# Patient Record
Sex: Female | Born: 1975 | Race: White | Hispanic: No | Smoking: Former smoker
Health system: Southern US, Community
[De-identification: ages and names within clinical notes are randomized; demographics above are authoritative.]

## PROBLEM LIST (undated history)

## (undated) DIAGNOSIS — IMO0002 Reserved for concepts with insufficient information to code with codable children: Secondary | ICD-10-CM

## (undated) DIAGNOSIS — K589 Irritable bowel syndrome without diarrhea: Secondary | ICD-10-CM

## (undated) DIAGNOSIS — B009 Herpesviral infection, unspecified: Secondary | ICD-10-CM

## (undated) DIAGNOSIS — N83209 Unspecified ovarian cyst, unspecified side: Secondary | ICD-10-CM

## (undated) DIAGNOSIS — F329 Major depressive disorder, single episode, unspecified: Secondary | ICD-10-CM

## (undated) DIAGNOSIS — R87612 Low grade squamous intraepithelial lesion on cytologic smear of cervix (LGSIL): Secondary | ICD-10-CM

## (undated) DIAGNOSIS — D239 Other benign neoplasm of skin, unspecified: Secondary | ICD-10-CM

## (undated) DIAGNOSIS — F32A Depression, unspecified: Secondary | ICD-10-CM

## (undated) DIAGNOSIS — A6920 Lyme disease, unspecified: Secondary | ICD-10-CM

## (undated) DIAGNOSIS — F419 Anxiety disorder, unspecified: Secondary | ICD-10-CM

## (undated) DIAGNOSIS — R87619 Unspecified abnormal cytological findings in specimens from cervix uteri: Secondary | ICD-10-CM

## (undated) HISTORY — PX: LAPAROSCOPY: SHX197

## (undated) HISTORY — DX: Reserved for concepts with insufficient information to code with codable children: IMO0002

## (undated) HISTORY — DX: Herpesviral infection, unspecified: B00.9

## (undated) HISTORY — DX: Unspecified abnormal cytological findings in specimens from cervix uteri: R87.619

## (undated) HISTORY — DX: Other benign neoplasm of skin, unspecified: D23.9

## (undated) HISTORY — DX: Lyme disease, unspecified: A69.20

## (undated) HISTORY — DX: Low grade squamous intraepithelial lesion on cytologic smear of cervix (LGSIL): R87.612

## (undated) HISTORY — DX: Irritable bowel syndrome, unspecified: K58.9

## (undated) HISTORY — PX: CYSTECTOMY: SUR359

## (undated) HISTORY — DX: Major depressive disorder, single episode, unspecified: F32.9

## (undated) HISTORY — DX: Anxiety disorder, unspecified: F41.9

## (undated) HISTORY — DX: Depression, unspecified: F32.A

## (undated) HISTORY — DX: Unspecified ovarian cyst, unspecified side: N83.209

## (undated) HISTORY — PX: WISDOM TOOTH EXTRACTION: SHX21

---

## 2004-05-25 ENCOUNTER — Other Ambulatory Visit: Admission: RE | Admit: 2004-05-25 | Discharge: 2004-05-25 | Payer: Self-pay | Admitting: Obstetrics and Gynecology

## 2004-07-05 ENCOUNTER — Emergency Department (HOSPITAL_COMMUNITY): Admission: EM | Admit: 2004-07-05 | Discharge: 2004-07-05 | Payer: Self-pay | Admitting: Emergency Medicine

## 2004-08-23 ENCOUNTER — Other Ambulatory Visit: Admission: RE | Admit: 2004-08-23 | Discharge: 2004-08-23 | Payer: Self-pay | Admitting: Obstetrics and Gynecology

## 2004-08-31 ENCOUNTER — Ambulatory Visit (HOSPITAL_COMMUNITY): Admission: RE | Admit: 2004-08-31 | Discharge: 2004-08-31 | Payer: Self-pay | Admitting: Family Medicine

## 2004-10-29 ENCOUNTER — Emergency Department (HOSPITAL_COMMUNITY): Admission: EM | Admit: 2004-10-29 | Discharge: 2004-10-29 | Payer: Self-pay | Admitting: Emergency Medicine

## 2004-11-17 ENCOUNTER — Ambulatory Visit: Payer: Self-pay | Admitting: Family Medicine

## 2004-11-19 ENCOUNTER — Ambulatory Visit (HOSPITAL_COMMUNITY): Admission: RE | Admit: 2004-11-19 | Discharge: 2004-11-19 | Payer: Self-pay | Admitting: *Deleted

## 2004-11-19 ENCOUNTER — Encounter (INDEPENDENT_AMBULATORY_CARE_PROVIDER_SITE_OTHER): Payer: Self-pay | Admitting: Specialist

## 2005-01-18 ENCOUNTER — Ambulatory Visit: Payer: Self-pay | Admitting: Family Medicine

## 2005-02-23 ENCOUNTER — Ambulatory Visit: Payer: Self-pay | Admitting: Internal Medicine

## 2005-03-08 ENCOUNTER — Ambulatory Visit: Payer: Self-pay | Admitting: Internal Medicine

## 2005-03-29 ENCOUNTER — Other Ambulatory Visit: Admission: RE | Admit: 2005-03-29 | Discharge: 2005-03-29 | Payer: Self-pay | Admitting: Obstetrics and Gynecology

## 2005-06-07 ENCOUNTER — Ambulatory Visit: Payer: Self-pay | Admitting: Internal Medicine

## 2005-06-10 ENCOUNTER — Ambulatory Visit: Payer: Self-pay | Admitting: Internal Medicine

## 2005-06-10 ENCOUNTER — Ambulatory Visit (HOSPITAL_COMMUNITY): Admission: RE | Admit: 2005-06-10 | Discharge: 2005-06-10 | Payer: Self-pay | Admitting: Internal Medicine

## 2005-09-06 ENCOUNTER — Other Ambulatory Visit: Admission: RE | Admit: 2005-09-06 | Discharge: 2005-09-06 | Payer: Self-pay | Admitting: Obstetrics and Gynecology

## 2005-10-28 ENCOUNTER — Encounter: Admission: RE | Admit: 2005-10-28 | Discharge: 2005-10-28 | Payer: Self-pay | Admitting: Gastroenterology

## 2006-04-27 ENCOUNTER — Other Ambulatory Visit: Admission: RE | Admit: 2006-04-27 | Discharge: 2006-04-27 | Payer: Self-pay | Admitting: Obstetrics and Gynecology

## 2006-05-05 ENCOUNTER — Ambulatory Visit (HOSPITAL_COMMUNITY): Admission: RE | Admit: 2006-05-05 | Discharge: 2006-05-05 | Payer: Self-pay | Admitting: Gastroenterology

## 2006-09-21 ENCOUNTER — Other Ambulatory Visit: Admission: RE | Admit: 2006-09-21 | Discharge: 2006-09-21 | Payer: Self-pay | Admitting: Obstetrics and Gynecology

## 2006-11-13 ENCOUNTER — Ambulatory Visit: Payer: Self-pay | Admitting: Family Medicine

## 2006-11-16 ENCOUNTER — Ambulatory Visit: Payer: Self-pay | Admitting: Family Medicine

## 2006-11-16 LAB — CONVERTED CEMR LAB
ALT: 19 units/L (ref 0–40)
AST: 18 units/L (ref 0–37)
Albumin: 4.3 g/dL (ref 3.5–5.2)
Alkaline Phosphatase: 27 units/L — ABNORMAL LOW (ref 39–117)
BUN: 8 mg/dL (ref 6–23)
Basophils Absolute: 0.1 10*3/uL (ref 0.0–0.1)
Basophils Relative: 1.2 % — ABNORMAL HIGH (ref 0.0–1.0)
CO2: 25 meq/L (ref 19–32)
Calcium: 9.4 mg/dL (ref 8.4–10.5)
Chloride: 98 meq/L (ref 96–112)
Creatinine, Ser: 0.9 mg/dL (ref 0.4–1.2)
Eosinophil percent: 1.2 % (ref 0.0–5.0)
GFR calc non Af Amer: 78 mL/min
Glomerular Filtration Rate, Af Am: 94 mL/min/{1.73_m2}
Glucose, Bld: 84 mg/dL (ref 70–99)
HCT: 42.3 % (ref 36.0–46.0)
Hemoglobin: 14.4 g/dL (ref 12.0–15.0)
Heterophile Ab Screen: NEGATIVE
Lymphocytes Relative: 24 % (ref 12.0–46.0)
MCHC: 34.1 g/dL (ref 30.0–36.0)
MCV: 95.3 fL (ref 78.0–100.0)
Monocytes Absolute: 0.3 10*3/uL (ref 0.2–0.7)
Monocytes Relative: 4.9 % (ref 3.0–11.0)
Neutro Abs: 4.7 10*3/uL (ref 1.4–7.7)
Neutrophils Relative %: 68.7 % (ref 43.0–77.0)
Platelets: 297 10*3/uL (ref 150–400)
Potassium: 4.2 meq/L (ref 3.5–5.1)
RBC: 4.44 M/uL (ref 3.87–5.11)
RDW: 11.4 % — ABNORMAL LOW (ref 11.5–14.6)
Sodium: 132 meq/L — ABNORMAL LOW (ref 135–145)
Total Bilirubin: 0.8 mg/dL (ref 0.3–1.2)
Total Protein: 7.1 g/dL (ref 6.0–8.3)
WBC: 6.8 10*3/uL (ref 4.5–10.5)

## 2006-11-17 ENCOUNTER — Ambulatory Visit: Payer: Self-pay | Admitting: Family Medicine

## 2006-11-22 ENCOUNTER — Ambulatory Visit: Payer: Self-pay | Admitting: Internal Medicine

## 2006-11-23 ENCOUNTER — Ambulatory Visit: Payer: Self-pay | Admitting: Internal Medicine

## 2006-11-23 LAB — CONVERTED CEMR LAB
BUN: 8 mg/dL (ref 6–23)
CO2: 26 meq/L (ref 19–32)
Calcium: 9.3 mg/dL (ref 8.4–10.5)
Chloride: 99 meq/L (ref 96–112)
Creatinine, Ser: 0.9 mg/dL (ref 0.4–1.2)
GFR calc non Af Amer: 78 mL/min
Glomerular Filtration Rate, Af Am: 94 mL/min/{1.73_m2}
Glucose, Bld: 96 mg/dL (ref 70–99)
Potassium: 4 meq/L (ref 3.5–5.1)
Sodium: 132 meq/L — ABNORMAL LOW (ref 135–145)

## 2008-02-07 ENCOUNTER — Telehealth (INDEPENDENT_AMBULATORY_CARE_PROVIDER_SITE_OTHER): Payer: Self-pay | Admitting: *Deleted

## 2008-02-08 ENCOUNTER — Ambulatory Visit: Payer: Self-pay | Admitting: Internal Medicine

## 2008-02-08 LAB — CONVERTED CEMR LAB
Bilirubin Urine: NEGATIVE
Blood in Urine, dipstick: NEGATIVE
Glucose, Urine, Semiquant: NEGATIVE
Ketones, urine, test strip: NEGATIVE
Nitrite: NEGATIVE
Protein, U semiquant: NEGATIVE
Specific Gravity, Urine: <1.005
Urobilinogen, UA: NEGATIVE
WBC Urine, dipstick: NEGATIVE
pH: 6.5

## 2008-02-09 ENCOUNTER — Encounter: Payer: Self-pay | Admitting: Internal Medicine

## 2008-02-12 ENCOUNTER — Encounter (INDEPENDENT_AMBULATORY_CARE_PROVIDER_SITE_OTHER): Payer: Self-pay | Admitting: *Deleted

## 2008-04-07 ENCOUNTER — Telehealth (INDEPENDENT_AMBULATORY_CARE_PROVIDER_SITE_OTHER): Payer: Self-pay | Admitting: *Deleted

## 2008-04-10 ENCOUNTER — Telehealth (INDEPENDENT_AMBULATORY_CARE_PROVIDER_SITE_OTHER): Payer: Self-pay | Admitting: *Deleted

## 2008-04-11 ENCOUNTER — Ambulatory Visit: Payer: Self-pay | Admitting: Internal Medicine

## 2008-04-11 DIAGNOSIS — R0602 Shortness of breath: Secondary | ICD-10-CM | POA: Insufficient documentation

## 2008-04-11 DIAGNOSIS — R5383 Other fatigue: Secondary | ICD-10-CM

## 2008-04-11 DIAGNOSIS — R5381 Other malaise: Secondary | ICD-10-CM | POA: Insufficient documentation

## 2008-04-11 LAB — CONVERTED CEMR LAB
Glucose, Bld: 109 mg/dL
Hemoglobin: 14.8 g/dL

## 2008-04-14 ENCOUNTER — Encounter (INDEPENDENT_AMBULATORY_CARE_PROVIDER_SITE_OTHER): Payer: Self-pay | Admitting: *Deleted

## 2008-04-14 LAB — CONVERTED CEMR LAB
ALT: 17 units/L (ref 0–35)
AST: 21 units/L (ref 0–37)
Albumin: 4.2 g/dL (ref 3.5–5.2)
Alkaline Phosphatase: 30 units/L — ABNORMAL LOW (ref 39–117)
BUN: 12 mg/dL (ref 6–23)
Basophils Absolute: 0 10*3/uL (ref 0.0–0.1)
Basophils Relative: 0.2 % (ref 0.0–1.0)
Bilirubin, Direct: 0.1 mg/dL (ref 0.0–0.3)
CO2: 28 meq/L (ref 19–32)
Calcium: 9.3 mg/dL (ref 8.4–10.5)
Chloride: 102 meq/L (ref 96–112)
Creatinine, Ser: 0.7 mg/dL (ref 0.4–1.2)
Eosinophils Absolute: 0.1 10*3/uL (ref 0.0–0.7)
Eosinophils Relative: 1.7 % (ref 0.0–5.0)
GFR calc Af Amer: 124 mL/min
GFR calc non Af Amer: 102 mL/min
Glucose, Bld: 90 mg/dL (ref 70–99)
HCT: 41.7 % (ref 36.0–46.0)
Hemoglobin: 13.9 g/dL (ref 12.0–15.0)
Lymphocytes Relative: 23.5 % (ref 12.0–46.0)
MCHC: 33.4 g/dL (ref 30.0–36.0)
MCV: 92.8 fL (ref 78.0–100.0)
Monocytes Absolute: 0.4 10*3/uL (ref 0.1–1.0)
Monocytes Relative: 7.9 % (ref 3.0–12.0)
Neutro Abs: 3.7 10*3/uL (ref 1.4–7.7)
Neutrophils Relative %: 66.7 % (ref 43.0–77.0)
Platelets: 274 10*3/uL (ref 150–400)
Potassium: 4.2 meq/L (ref 3.5–5.1)
RBC: 4.5 M/uL (ref 3.87–5.11)
RDW: 11.8 % (ref 11.5–14.6)
Sed Rate: 9 mm/hr (ref 0–22)
Sodium: 134 meq/L — ABNORMAL LOW (ref 135–145)
TSH: 1.46 microintl units/mL (ref 0.35–5.50)
Total Bilirubin: 1 mg/dL (ref 0.3–1.2)
Total CK: 60 units/L (ref 7–177)
Total Protein: 6.7 g/dL (ref 6.0–8.3)
WBC: 5.5 10*3/uL (ref 4.5–10.5)

## 2008-05-16 ENCOUNTER — Ambulatory Visit: Payer: Self-pay | Admitting: Family Medicine

## 2008-05-16 ENCOUNTER — Encounter: Payer: Self-pay | Admitting: Family Medicine

## 2008-05-16 ENCOUNTER — Telehealth (INDEPENDENT_AMBULATORY_CARE_PROVIDER_SITE_OTHER): Payer: Self-pay | Admitting: *Deleted

## 2008-05-16 DIAGNOSIS — J209 Acute bronchitis, unspecified: Secondary | ICD-10-CM | POA: Insufficient documentation

## 2008-05-19 ENCOUNTER — Emergency Department (HOSPITAL_COMMUNITY): Admission: EM | Admit: 2008-05-19 | Discharge: 2008-05-19 | Payer: Self-pay | Admitting: Emergency Medicine

## 2008-05-19 ENCOUNTER — Telehealth (INDEPENDENT_AMBULATORY_CARE_PROVIDER_SITE_OTHER): Payer: Self-pay | Admitting: *Deleted

## 2008-05-19 ENCOUNTER — Encounter (INDEPENDENT_AMBULATORY_CARE_PROVIDER_SITE_OTHER): Payer: Self-pay | Admitting: *Deleted

## 2008-05-19 LAB — CONVERTED CEMR LAB
ALT: 14 units/L (ref 0–35)
AST: 15 units/L (ref 0–37)
Albumin: 4.6 g/dL (ref 3.5–5.2)
Alkaline Phosphatase: 32 units/L — ABNORMAL LOW (ref 39–117)
BUN: 11 mg/dL (ref 6–23)
Basophils Absolute: 0 10*3/uL (ref 0.0–0.1)
Basophils Relative: 0 % (ref 0–1)
Bilirubin, Direct: 0.1 mg/dL (ref 0.0–0.3)
CO2: 21 meq/L (ref 19–32)
Calcium: 9.5 mg/dL (ref 8.4–10.5)
Chloride: 101 meq/L (ref 96–112)
Creatinine, Ser: 0.77 mg/dL (ref 0.40–1.20)
Eosinophils Absolute: 0 10*3/uL (ref 0.0–0.7)
Eosinophils Relative: 0 % (ref 0–5)
Glucose, Bld: 91 mg/dL (ref 70–99)
HCT: 41.5 % (ref 36.0–46.0)
Hemoglobin: 13.8 g/dL (ref 12.0–15.0)
Indirect Bilirubin: 0.5 mg/dL (ref 0.0–0.9)
Lymphocytes Relative: 16 % (ref 12–46)
Lymphs Abs: 1.3 10*3/uL (ref 0.7–4.0)
MCHC: 33.3 g/dL (ref 30.0–36.0)
MCV: 92 fL (ref 78.0–100.0)
Mono Screen: NEGATIVE
Monocytes Absolute: 0.4 10*3/uL (ref 0.1–1.0)
Monocytes Relative: 5 % (ref 3–12)
Neutro Abs: 6.4 10*3/uL (ref 1.7–7.7)
Neutrophils Relative %: 78 % — ABNORMAL HIGH (ref 43–77)
Platelets: 271 10*3/uL (ref 150–400)
Potassium: 4.4 meq/L (ref 3.5–5.3)
RBC: 4.51 M/uL (ref 3.87–5.11)
RDW: 12.6 % (ref 11.5–15.5)
Sodium: 135 meq/L (ref 135–145)
Total Bilirubin: 0.6 mg/dL (ref 0.3–1.2)
Total Protein: 7.2 g/dL (ref 6.0–8.3)
WBC: 8.2 10*3/uL (ref 4.0–10.5)

## 2008-05-20 ENCOUNTER — Telehealth (INDEPENDENT_AMBULATORY_CARE_PROVIDER_SITE_OTHER): Payer: Self-pay | Admitting: *Deleted

## 2008-05-21 ENCOUNTER — Ambulatory Visit: Payer: Self-pay | Admitting: Internal Medicine

## 2008-05-21 DIAGNOSIS — R0609 Other forms of dyspnea: Secondary | ICD-10-CM | POA: Insufficient documentation

## 2008-05-21 DIAGNOSIS — R0989 Other specified symptoms and signs involving the circulatory and respiratory systems: Secondary | ICD-10-CM

## 2008-05-21 DIAGNOSIS — R011 Cardiac murmur, unspecified: Secondary | ICD-10-CM | POA: Insufficient documentation

## 2008-05-21 DIAGNOSIS — R079 Chest pain, unspecified: Secondary | ICD-10-CM | POA: Insufficient documentation

## 2008-05-23 ENCOUNTER — Ambulatory Visit: Payer: Self-pay | Admitting: Cardiology

## 2008-05-23 ENCOUNTER — Encounter: Payer: Self-pay | Admitting: Internal Medicine

## 2008-05-26 ENCOUNTER — Telehealth (INDEPENDENT_AMBULATORY_CARE_PROVIDER_SITE_OTHER): Payer: Self-pay | Admitting: *Deleted

## 2008-05-26 ENCOUNTER — Encounter (INDEPENDENT_AMBULATORY_CARE_PROVIDER_SITE_OTHER): Payer: Self-pay | Admitting: *Deleted

## 2008-05-27 ENCOUNTER — Ambulatory Visit: Payer: Self-pay | Admitting: Family Medicine

## 2008-05-28 LAB — CONVERTED CEMR LAB
ANA Titer 1: 1:40 {titer} — ABNORMAL HIGH
Anti Nuclear Antibody(ANA): POSITIVE — AB
Rheumatoid fact SerPl-aCnc: <20 intl units/mL — ABNORMAL LOW (ref 0.0–20.0)
Sed Rate: 4 mm/hr (ref 0–22)
Total CK: 27 units/L (ref 7–177)
Vit D, 1,25-Dihydroxy: 31 (ref 30–89)

## 2008-05-29 ENCOUNTER — Encounter (INDEPENDENT_AMBULATORY_CARE_PROVIDER_SITE_OTHER): Payer: Self-pay | Admitting: *Deleted

## 2008-05-29 ENCOUNTER — Telehealth (INDEPENDENT_AMBULATORY_CARE_PROVIDER_SITE_OTHER): Payer: Self-pay | Admitting: *Deleted

## 2008-06-03 ENCOUNTER — Ambulatory Visit: Payer: Self-pay | Admitting: Internal Medicine

## 2008-06-03 ENCOUNTER — Encounter: Payer: Self-pay | Admitting: Family Medicine

## 2008-06-06 ENCOUNTER — Encounter (INDEPENDENT_AMBULATORY_CARE_PROVIDER_SITE_OTHER): Payer: Self-pay | Admitting: *Deleted

## 2008-06-20 ENCOUNTER — Ambulatory Visit: Payer: Self-pay | Admitting: Internal Medicine

## 2008-06-20 DIAGNOSIS — J309 Allergic rhinitis, unspecified: Secondary | ICD-10-CM | POA: Insufficient documentation

## 2008-07-02 ENCOUNTER — Ambulatory Visit: Payer: Self-pay

## 2008-07-02 ENCOUNTER — Encounter: Payer: Self-pay | Admitting: Internal Medicine

## 2008-07-07 ENCOUNTER — Encounter: Payer: Self-pay | Admitting: Internal Medicine

## 2008-07-10 ENCOUNTER — Ambulatory Visit: Payer: Self-pay | Admitting: Internal Medicine

## 2008-07-31 ENCOUNTER — Encounter: Payer: Self-pay | Admitting: Family Medicine

## 2008-09-08 ENCOUNTER — Encounter: Payer: Self-pay | Admitting: Family Medicine

## 2008-09-10 ENCOUNTER — Ambulatory Visit (HOSPITAL_BASED_OUTPATIENT_CLINIC_OR_DEPARTMENT_OTHER): Admission: RE | Admit: 2008-09-10 | Discharge: 2008-09-10 | Payer: Self-pay | Admitting: Rheumatology

## 2008-09-13 ENCOUNTER — Ambulatory Visit: Payer: Self-pay | Admitting: Internal Medicine

## 2008-10-13 ENCOUNTER — Encounter: Payer: Self-pay | Admitting: Family Medicine

## 2008-10-16 ENCOUNTER — Encounter: Payer: Self-pay | Admitting: Internal Medicine

## 2008-10-29 ENCOUNTER — Telehealth (INDEPENDENT_AMBULATORY_CARE_PROVIDER_SITE_OTHER): Payer: Self-pay | Admitting: *Deleted

## 2008-12-09 ENCOUNTER — Encounter: Payer: Self-pay | Admitting: Family Medicine

## 2008-12-25 ENCOUNTER — Encounter: Payer: Self-pay | Admitting: Family Medicine

## 2009-04-13 ENCOUNTER — Telehealth (INDEPENDENT_AMBULATORY_CARE_PROVIDER_SITE_OTHER): Payer: Self-pay | Admitting: *Deleted

## 2009-05-24 ENCOUNTER — Observation Stay (HOSPITAL_COMMUNITY): Admission: AD | Admit: 2009-05-24 | Discharge: 2009-05-25 | Payer: Self-pay | Admitting: Obstetrics and Gynecology

## 2009-06-30 ENCOUNTER — Inpatient Hospital Stay (HOSPITAL_COMMUNITY): Admission: AD | Admit: 2009-06-30 | Discharge: 2009-07-01 | Payer: Self-pay | Admitting: Obstetrics and Gynecology

## 2009-08-22 ENCOUNTER — Inpatient Hospital Stay (HOSPITAL_COMMUNITY): Admission: AD | Admit: 2009-08-22 | Discharge: 2009-08-22 | Payer: Self-pay | Admitting: Obstetrics and Gynecology

## 2009-09-03 ENCOUNTER — Inpatient Hospital Stay (HOSPITAL_COMMUNITY): Admission: RE | Admit: 2009-09-03 | Discharge: 2009-09-03 | Payer: Self-pay | Admitting: Obstetrics and Gynecology

## 2009-09-04 ENCOUNTER — Inpatient Hospital Stay (HOSPITAL_COMMUNITY): Admission: RE | Admit: 2009-09-04 | Discharge: 2009-09-07 | Payer: Self-pay | Admitting: Obstetrics and Gynecology

## 2009-09-04 ENCOUNTER — Encounter (INDEPENDENT_AMBULATORY_CARE_PROVIDER_SITE_OTHER): Payer: Self-pay | Admitting: Obstetrics and Gynecology

## 2009-09-11 ENCOUNTER — Inpatient Hospital Stay (HOSPITAL_COMMUNITY): Admission: AD | Admit: 2009-09-11 | Discharge: 2009-09-11 | Payer: Self-pay | Admitting: Obstetrics and Gynecology

## 2009-09-22 ENCOUNTER — Encounter: Payer: Self-pay | Admitting: Family Medicine

## 2009-11-28 HISTORY — PX: SEPTOPLASTY WITH ETHMOIDECTOMY, AND MAXILLARY ANTROSTOMY: SHX6090

## 2010-04-21 IMAGING — US US AMNIOCENTESIS
1 series · 3 of 3 positions shown · non-contrast
Comparison: none

CLINICAL DATA: ULTRASOUND-GUIDED AMNIOCENTESIS:
 Ultrasound was utilized to perform amniocentesis by the requesting physician.

[Series 1: us amniocentesis · 3 of 3 slices shown]
[im 1/3]
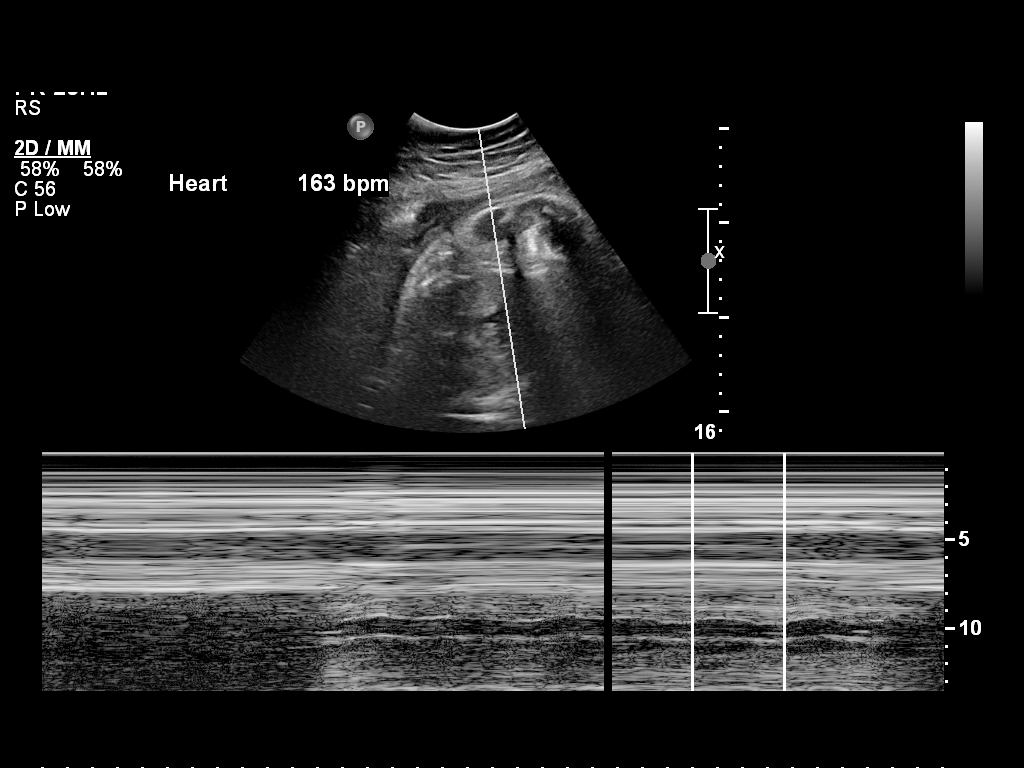
[im 2/3]
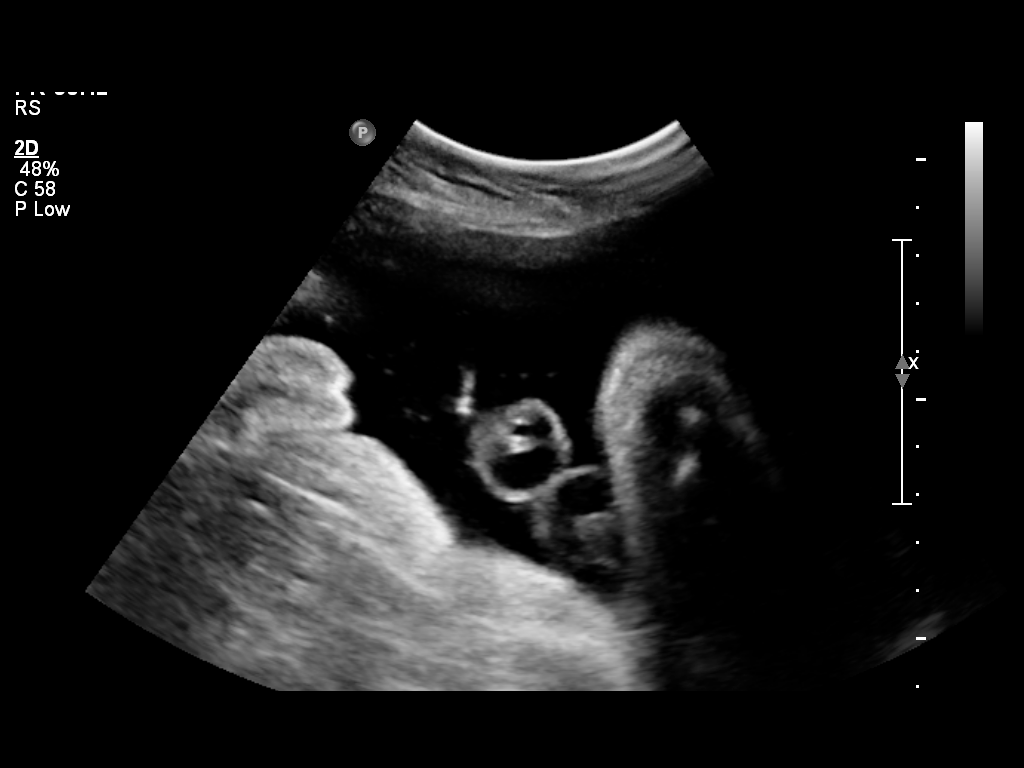
[im 3/3]
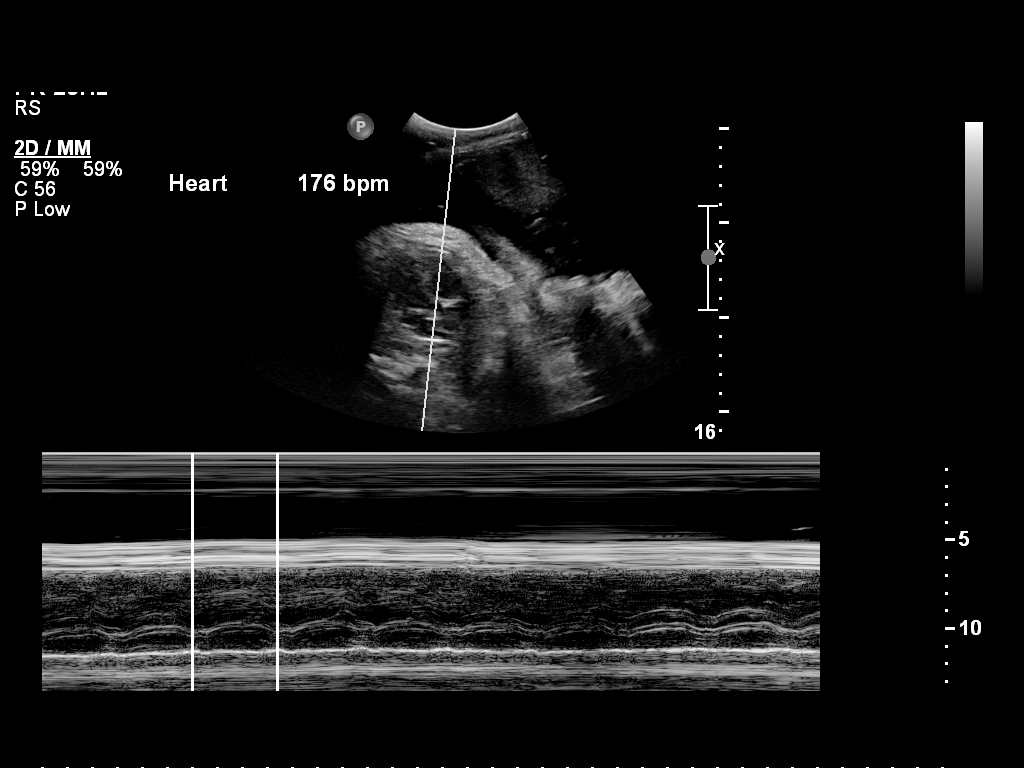

[3 of 3 positions shown; findings below may reference images not displayed]

IMPRESSION: No radiographic interpretation.

## 2011-03-03 LAB — CBC
HCT: 31.6 % — ABNORMAL LOW (ref 36.0–46.0)
Hemoglobin: 10.7 g/dL — ABNORMAL LOW (ref 12.0–15.0)
MCV: 92.5 fL (ref 78.0–100.0)
RBC: 4.02 MIL/uL (ref 3.87–5.11)
WBC: 11.6 10*3/uL — ABNORMAL HIGH (ref 4.0–10.5)
WBC: 15.4 10*3/uL — ABNORMAL HIGH (ref 4.0–10.5)

## 2011-03-03 LAB — TYPE AND SCREEN
ABO/RH(D): A POS
Antibody Screen: NEGATIVE

## 2011-03-03 LAB — LSPG (L/S RATIO WITH PG)-AMNIO FLUID

## 2011-03-03 LAB — RPR: RPR Ser Ql: NONREACTIVE

## 2011-03-04 LAB — CULTURE, BETA STREP (GROUP B ONLY)

## 2011-03-04 LAB — URINALYSIS, ROUTINE W REFLEX MICROSCOPIC
Bilirubin Urine: NEGATIVE
Glucose, UA: NEGATIVE mg/dL
Hgb urine dipstick: NEGATIVE
Specific Gravity, Urine: 1.01 (ref 1.005–1.030)
pH: 5.5 (ref 5.0–8.0)

## 2011-03-04 LAB — WET PREP, GENITAL: Trich, Wet Prep: NONE SEEN

## 2011-03-04 LAB — GC/CHLAMYDIA PROBE AMP, GENITAL: Chlamydia, DNA Probe: NEGATIVE

## 2011-03-05 LAB — URINALYSIS, ROUTINE W REFLEX MICROSCOPIC
Bilirubin Urine: NEGATIVE
Hgb urine dipstick: NEGATIVE
Nitrite: NEGATIVE
Specific Gravity, Urine: <1.005 — ABNORMAL LOW (ref 1.005–1.030)
pH: 5.5 (ref 5.0–8.0)

## 2011-03-05 LAB — URINE MICROSCOPIC-ADD ON

## 2011-03-05 LAB — WET PREP, GENITAL: Trich, Wet Prep: NONE SEEN

## 2011-03-07 LAB — URINE MICROSCOPIC-ADD ON: RBC / HPF: NONE SEEN RBC/hpf (ref <3)

## 2011-03-07 LAB — URINALYSIS, ROUTINE W REFLEX MICROSCOPIC
Glucose, UA: NEGATIVE mg/dL
Hgb urine dipstick: NEGATIVE
Protein, ur: NEGATIVE mg/dL
pH: 7 (ref 5.0–8.0)

## 2011-03-07 LAB — CBC
HCT: 35 % — ABNORMAL LOW (ref 36.0–46.0)
Platelets: 192 10*3/uL (ref 150–400)
WBC: 10 10*3/uL (ref 4.0–10.5)

## 2011-04-12 NOTE — Discharge Summary (Signed)
NAMENEYSHA, CRIADO NO.:  [id number]   MEDICAL RECORD NO.:  [id number]          PATIENT TYPE:  OBV   LOCATION:  9151                          FACILITY:  [facility]   PHYSICIAN:  Hal Morales, M.D.DATE OF BIRTH:  Aug 01, 1976   DATE OF ADMISSION:  05/24/2009  DATE OF DISCHARGE:  05/25/2009                               DISCHARGE SUMMARY   HISTORY OF PRESENT ILLNESS:  A 35 year old gravida 1, para 0 at 62 and  1/7th week who presented to [facility] for lower abdominal pain.  She  does have a history of low-lying placenta previa.  Her EDC is September 25, 2009.   ADMITTING DIAGNOSES:  Previa on an 18-week ultrasound, posterior previa  placenta, allergy, history of herpes simplex virus II, and history of  abnormal pap test.   DISCHARGE DIAGNOSES:  1. Previa.  2. PENICILLIN allergies.  3. Herpes simplex virus 2.  4. Pap within normal limits.   LABORATORY DATA:  She is A positive, antibiotic screen negative, rubella  immune, hemoglobin was 12.2.  HSV was positive on Neo B admission.   PROCEDURE:  She had an ultrasound on the 27th.   Her cervix is closed.  Fetal heart tone 147, posterior previa.  Her AFI  was within normal limits according to the ultrasound.  On day of  discharge, which is May 25, 2009, she is 22-2/7th week.  She does  complain of continuing clear watery discharge from vagina but not  increased from previously, positive fetal movement.  No bleeding from  uterine crampiness.  Fetal heart tones 150 to 160.  Hemoglobin was 12.3.  She is A positive.  Normal ultrasound, May 24, 2009.  Lungs clear.  CTAB.  Normal heart rate.  Regular rhythm.  Abdomen soft and nontender.  Gravida 1-2 cm above umbilicus.  Average gestational age.  DTRs are +1  x2.  Negative Homans x2.   ASSESSMENT:  A 35 year old gravida 1 at 22-2/7th weeks previa uterine  irritability.   The plan is to discharge home after AFI, and AFI was normal.   DISCHARGE INSTRUCTIONS:  1.  Continue Rhinocort nasal spray, Pulmicort Flexhaler, ibuprofen 800      mg, and Vicodin.  2. She is to be off work for 1 week.  3. Pre-term labor precautions were discussed, and the patient      verbalizes understanding.  She is to follow up with Dr. Pennie Rushing on      June 02, 2009, or sooner if necessary.  The patient verbalizes      understanding.      Jasmine Awe, CNM      Hal Morales, M.D.  Electronically Signed    JM/MEDQ  D:  05/25/2009  T:  05/26/2009  Job:  [id number]

## 2011-04-12 NOTE — Procedures (Signed)
NAMELANORE, RENDEROS NO.:  [id number]   MEDICAL RECORD NO.:  [id number]          PATIENT TYPE:  OUT   LOCATION:  SLEEP CENTER                 FACILITY:  [facility]   PHYSICIAN:  Clinton D. Maple Hudson, MD, FCCP, FACPDATE OF BIRTH:  1975-03-02   DATE OF STUDY:  09/10/2008                            NOCTURNAL POLYSOMNOGRAM   REFERRING PHYSICIAN:  Pollyann Savoy, M.D.   INDICATION FOR STUDY:  Hypersomnia with sleep apnea.   EPWORTH SLEEPINESS SCORE:  Epworth sleepiness score 16/24.  BMI 25.4.  Weight 162 pounds.  Height 67 inches.  Neck 13 inches.   MEDICATIONS:  Home medication charted and reviewed.   SLEEP ARCHITECTURE:  Total sleep time 330 minutes with sleep efficiency  78%.  Stage I 10.1%.  Stage II 66.6%.  Stage III 12.7%.  REM 10.6% of  total sleep time.  Sleep latency 26 minutes.  REM latency 146 minutes.  Awake after sleep onset 67 minutes.  Arousal index 18.7.  No bedtime  medication was reported.  She was awake from about 4:45 a.m. and then  awake again after 5 a.m.   RESPIRATORY DATA:  Apnea-hypopnea index (AHI) 0.2 per hour.  A single  event was scored as an obstructive apnea while lying supine in non-REM  sleep.   OXYGEN DATA:  No snoring noted by the technician.  Oxygen desaturation  to a nadir of 96% with mean oxygen saturation 97.1% on room air through  the study.   CARDIAC DATA:  Normal sinus rhythm.   MOVEMENT-PARASOMNIA:  No significant movement disturbance.  Bathroom x3.   IMPRESSIONS-RECOMMENDATIONS:  1. Sleep architecture significant for little maintained sleep after 4      a.m. with reduced total percentage REM.  This pattern can be      indicative of depression, but is nonspecific.  2. Rare respiratory events with sleep disturbance, apnea-hypopnea      index 0.2 per hour (normal range 0 to 5 per      hour).  No snoring noted.  Mean oxygen saturation 97.1%.  3. Bathroom x3.      Clinton D. Maple Hudson, MD, [facility], FACP  Diplomate, Research scientist (medical) of Sleep Medicine  Electronically Signed     CDY/MEDQ  D:  09/13/2008 18:33:41  T:  09/14/2008 02:03:36  Job:  [id number]
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
## 2011-08-25 LAB — DIFFERENTIAL
Basophils Absolute: 0
Basophils Relative: 0
Eosinophils Absolute: 0
Eosinophils Relative: 0
Lymphocytes Relative: 5 — ABNORMAL LOW
Lymphs Abs: 0.5 — ABNORMAL LOW
Monocytes Absolute: 0.1
Monocytes Relative: 1 — ABNORMAL LOW
Neutro Abs: 9.8 — ABNORMAL HIGH
Neutrophils Relative %: 93 — ABNORMAL HIGH

## 2011-08-25 LAB — COMPREHENSIVE METABOLIC PANEL WITH GFR
Alkaline Phosphatase: 35 — ABNORMAL LOW
BUN: 10
Calcium: 10
Creatinine, Ser: 0.83
Glucose, Bld: 108 — ABNORMAL HIGH
Total Protein: 7.5

## 2011-08-25 LAB — URINALYSIS, ROUTINE W REFLEX MICROSCOPIC
Bilirubin Urine: NEGATIVE
Glucose, UA: NEGATIVE
Hgb urine dipstick: NEGATIVE
Ketones, ur: NEGATIVE
Nitrite: NEGATIVE
Protein, ur: NEGATIVE
Specific Gravity, Urine: 1.004 — ABNORMAL LOW
Urobilinogen, UA: 0.2
pH: 6.5

## 2011-08-25 LAB — MONONUCLEOSIS SCREEN: Mono Screen: NEGATIVE

## 2011-08-25 LAB — COMPREHENSIVE METABOLIC PANEL
ALT: 19
AST: 21
Albumin: 4.4
CO2: 26
Chloride: 99
GFR calc Af Amer: >60
GFR calc non Af Amer: >60
Potassium: 4.2
Sodium: 135
Total Bilirubin: 1.1

## 2011-08-25 LAB — CBC
HCT: 44.7
Hemoglobin: 15.6 — ABNORMAL HIGH
MCHC: 35
MCV: 90.1
Platelets: 291
RBC: 4.96
RDW: 12
WBC: 10.5

## 2011-08-25 LAB — LIPASE, BLOOD: Lipase: 17

## 2011-08-25 LAB — POCT PREGNANCY, URINE
Operator id: 264031
Preg Test, Ur: NEGATIVE

## 2012-09-04 ENCOUNTER — Telehealth: Payer: Self-pay | Admitting: Obstetrics and Gynecology

## 2012-09-12 ENCOUNTER — Encounter: Payer: Self-pay | Admitting: Obstetrics and Gynecology

## 2012-09-12 ENCOUNTER — Ambulatory Visit (INDEPENDENT_AMBULATORY_CARE_PROVIDER_SITE_OTHER): Payer: Managed Care, Other (non HMO) | Admitting: Obstetrics and Gynecology

## 2012-09-12 ENCOUNTER — Other Ambulatory Visit: Payer: Self-pay | Admitting: Obstetrics and Gynecology

## 2012-09-12 ENCOUNTER — Ambulatory Visit (INDEPENDENT_AMBULATORY_CARE_PROVIDER_SITE_OTHER): Payer: Managed Care, Other (non HMO)

## 2012-09-12 VITALS — BP 126/72 | Temp 98.3°F | Ht 67.0 in | Wt 182.0 lb

## 2012-09-12 DIAGNOSIS — N949 Unspecified condition associated with female genital organs and menstrual cycle: Secondary | ICD-10-CM

## 2012-09-12 DIAGNOSIS — N83209 Unspecified ovarian cyst, unspecified side: Secondary | ICD-10-CM | POA: Insufficient documentation

## 2012-09-12 DIAGNOSIS — R102 Pelvic and perineal pain: Secondary | ICD-10-CM

## 2012-09-12 DIAGNOSIS — IMO0002 Reserved for concepts with insufficient information to code with codable children: Secondary | ICD-10-CM | POA: Insufficient documentation

## 2012-09-12 DIAGNOSIS — F419 Anxiety disorder, unspecified: Secondary | ICD-10-CM | POA: Insufficient documentation

## 2012-09-12 DIAGNOSIS — F32A Depression, unspecified: Secondary | ICD-10-CM

## 2012-09-12 DIAGNOSIS — F418 Other specified anxiety disorders: Secondary | ICD-10-CM | POA: Insufficient documentation

## 2012-09-12 DIAGNOSIS — F329 Major depressive disorder, single episode, unspecified: Secondary | ICD-10-CM

## 2012-09-12 DIAGNOSIS — F3289 Other specified depressive episodes: Secondary | ICD-10-CM

## 2012-09-12 DIAGNOSIS — B009 Herpesviral infection, unspecified: Secondary | ICD-10-CM | POA: Insufficient documentation

## 2012-09-12 LAB — POCT URINALYSIS DIPSTICK
Bilirubin, UA: NEGATIVE
Ketones, UA: NEGATIVE
Leukocytes, UA: NEGATIVE
Protein, UA: NEGATIVE
Spec Grav, UA: <=1.005

## 2012-09-12 MED ORDER — ESCITALOPRAM OXALATE 10 MG PO TABS: 20.0000 mg | ORAL_TABLET | Freq: Every day | ORAL | Status: DC

## 2012-09-12 MED ORDER — DESOGESTREL-ETHINYL ESTRADIOL 0.15-0.02/0.01 MG (21/5) PO TABS: 1.0000 | ORAL_TABLET | Freq: Every day | ORAL | Status: DC

## 2012-09-12 NOTE — Progress Notes (Signed)
Pelvic Pain:  GYN PROBLEM VISIT  [name] is a 36 y.o. year old female,G1P1, who presents for a problem visit. D/c'd BCPs 8 mos ago and periods were OK with manageable cramps till 2 mos ago.  Restarted BCPs last cycle but pain has not resolved completely.  Now noticed increased fatigue and depression.  Did stop Lexapro 5 mos ago  Subjective:  Pt c/o Pelvic Pain x 1 month. States that she began bc pill again and it seem to help a little. But she still feels "achy". States that it is mostly on left side.no urinary symptoms.  No nausea vomiting or diarrhea.   She denies dysuria or other urinary symptoms.  Wants to talk about changing bc pills because she has been feeling moody. She has a long history of depression and deviated recently discontinued her antidepressants.  H also be noted that she has moved from [place] to [place] and started a new position all within the last 3 months.   Also c/o rash on chest for the last 2 weeks.  The rash is ready and a red with itching Pt states that she had pap done at another Dr. In [place].   Vag. Discharge:no Odor:no Fever:no Irreg.Periods:yes Dyspareunia:yes Dysuria:no Frequency:no Urgency:no Hematuria:no Kidney stones:no Constipation:no Diarrhea:no Rectal Bleeding: no Vomiting:no Nausea:no Pregnant:no Fibroids:no Endometriosis:no Hx of Ovarian Cyst:yes Hx IUD:no Hx STD-PID:yes Appendectomy:no Gall Bladder Dz:no  Objective:  BP 126/72  Ht 5\' 7"  (1.702 m)  Wt 182 lb (82.555 kg)  BMI 28.51 kg/m2  LMP 09/11/2012   General: alert, cooperative and ray E fine papular rash over the sternal area  External genitalia: normal general appearance Vaginal: normal mucosa without prolapse or lesions Cervix: normal appearance Adnexa: no  Masses were noted.  There was tenderness on the left greater than on the right. Uterus: normal single, nontender  ULTRASOUND: Uterus is antevert and was in normal limits Endometrium is normal at 6  mm Ovaries normal bilaterally No free fluid Significant bowel gas and stool note  Assessment: New onset pelvic pain.  Cannot completely rule out endometriosis. Mood changes in patient with history of chronic depression, now status post recent discontinuation of antidepressant Circumscribed dermatitis without clear etiology  Plan: Patient is to continue her current birth control pill Restart Lexapro 20 mg daily Rate for a primary care physician for management of depression Return to office in 3 month(s).   Dierdre Forth, MD  09/12/2012 10:45 AM

## 2012-09-13 ENCOUNTER — Telehealth: Payer: Self-pay

## 2012-09-13 NOTE — Telephone Encounter (Signed)
Referral appt sched 09/26/12 @10 :00a with Dr. Laury Axon for fu of depression per vph. Pt agrees. Records faxed to [phone].

## 2012-09-24 ENCOUNTER — Telehealth: Payer: Self-pay | Admitting: Obstetrics and Gynecology

## 2012-09-24 NOTE — Telephone Encounter (Signed)
Vm from pt.  States has not received RX from  [facility].  TC to pt.  LM that Rx has been sent per CW.  To call pharmacy and if any problem to call us.

## 2012-09-26 ENCOUNTER — Ambulatory Visit: Payer: Self-pay | Admitting: Family Medicine

## 2012-09-28 ENCOUNTER — Ambulatory Visit (INDEPENDENT_AMBULATORY_CARE_PROVIDER_SITE_OTHER): Payer: Managed Care, Other (non HMO) | Admitting: Family Medicine

## 2012-09-28 ENCOUNTER — Encounter: Payer: Self-pay | Admitting: Family Medicine

## 2012-09-28 VITALS — BP 122/74 | HR 82 | Temp 98.3°F | Wt 182.0 lb

## 2012-09-28 DIAGNOSIS — F418 Other specified anxiety disorders: Secondary | ICD-10-CM

## 2012-09-28 DIAGNOSIS — J329 Chronic sinusitis, unspecified: Secondary | ICD-10-CM

## 2012-09-28 DIAGNOSIS — F341 Dysthymic disorder: Secondary | ICD-10-CM

## 2012-09-28 MED ORDER — OLOPATADINE HCL 0.6 % NA SOLN: NASAL | Status: DC

## 2012-09-28 MED ORDER — MOXIFLOXACIN HCL 400 MG PO TABS: 400.0000 mg | ORAL_TABLET | Freq: Every day | ORAL | Status: DC

## 2012-09-28 MED ORDER — ESCITALOPRAM OXALATE 20 MG PO TABS: 20.0000 mg | ORAL_TABLET | Freq: Every day | ORAL | Status: DC

## 2012-09-28 NOTE — Progress Notes (Signed)
  Subjective:     Carolyn Hebert is a 36 y.o. female who presents for evaluation of sinus pain. Symptoms include: congestion, cough, facial pain, fevers, nasal congestion and sinus pressure. Onset of symptoms was 10 days ago. Symptoms have been gradually worsening since that time. Past history is significant for frequent episodes of bronchitis. Patient is a non-smoker.  Pt also needs refill on lexapro for anxiety and depresion.   The following portions of the patient's history were reviewed and updated as appropriate: allergies, current medications, past family history, past medical history, past social history, past surgical history and problem list.  Review of Systems Pertinent items are noted in HPI.   Objective:    BP 122/74  Pulse 82  Temp 98.3 F (36.8 C) (Oral)  Wt 182 lb (82.555 kg)  SpO2 99%  LMP 09/11/2012 General appearance: alert, cooperative, appears stated age and no distress Ears: normal TM's and external ear canals both ears Nose: green discharge, moderate congestion, turbinates red, swollen, sinus tenderness bilateral Throat: lips, mucosa, and tongue normal; teeth and gums normal Neck: no adenopathy, supple, symmetrical, trachea midline and thyroid not enlarged, symmetric, no tenderness/mass/nodules Lungs: clear to auscultation bilaterally  Psych-- no depression anxiety   Assessment:    Acute bacterial sinusitis Depression/ anxiety--- refill Lexapro.    Plan:    Nasal steroids per medication orders. Antihistamines per medication orders. avelox per medication orders. rto prn

## 2012-09-28 NOTE — Patient Instructions (Addendum)

## 2012-10-11 ENCOUNTER — Ambulatory Visit: Payer: Managed Care, Other (non HMO)

## 2012-10-17 ENCOUNTER — Ambulatory Visit (INDEPENDENT_AMBULATORY_CARE_PROVIDER_SITE_OTHER): Payer: Managed Care, Other (non HMO)

## 2012-10-17 DIAGNOSIS — Z23 Encounter for immunization: Secondary | ICD-10-CM

## 2012-10-19 ENCOUNTER — Telehealth: Payer: Self-pay | Admitting: Family Medicine

## 2012-10-19 ENCOUNTER — Ambulatory Visit (INDEPENDENT_AMBULATORY_CARE_PROVIDER_SITE_OTHER): Payer: Managed Care, Other (non HMO) | Admitting: Family Medicine

## 2012-10-19 ENCOUNTER — Encounter: Payer: Self-pay | Admitting: Family Medicine

## 2012-10-19 VITALS — BP 140/90 | HR 74 | Temp 98.8°F | Wt 183.0 lb

## 2012-10-19 DIAGNOSIS — J069 Acute upper respiratory infection, unspecified: Secondary | ICD-10-CM | POA: Insufficient documentation

## 2012-10-19 DIAGNOSIS — J029 Acute pharyngitis, unspecified: Secondary | ICD-10-CM

## 2012-10-19 LAB — POCT RAPID STREP A (OFFICE): Rapid Strep A Screen: NEGATIVE

## 2012-10-19 MED ORDER — AZITHROMYCIN 250 MG PO TABS: ORAL_TABLET | ORAL | Status: DC

## 2012-10-19 NOTE — Progress Notes (Signed)
  Subjective:    Patient ID: Carolyn Hebert, female    DOB: 1976-10-03, 36 y.o.   MRN: [id number]  HPI Sore throat- sxs started yesterday, woke up this AM and 'it was killing me'.  + aches, fatigue.  No cough.  No nasal congestion.  No fevers.  + sick contacts.   Review of Systems For ROS see HPI     Objective:   Physical Exam  Vitals reviewed. Constitutional: She appears well-developed and well-nourished. No distress.  HENT:  Head: Normocephalic and atraumatic.  Nose: Nose normal.       TMs normal bilaterally No TTP over sinuses Posterior pharyngeal erythema but no tonsillar enlargement or exudate  Eyes: Conjunctivae normal and EOM are normal. Pupils are equal, round, and reactive to light.  Neck: Normal range of motion. Neck supple.  Cardiovascular: Normal rate, regular rhythm and normal heart sounds.   Pulmonary/Chest: Effort normal and breath sounds normal. No respiratory distress. She has no wheezes. She has no rales.  Lymphadenopathy:    She has no cervical adenopathy.          Assessment & Plan:

## 2012-10-19 NOTE — Telephone Encounter (Signed)
Patient Information:  Caller Name: Maezie  Phone: [phone]  Patient: Carolyn Hebert  Gender: Female  DOB: [birth year]  Age: 36 Years  PCP: Lelon Perla.  Pregnant: No   Symptoms  Reason For Call & Symptoms: +sore throat onset yesterday, 10/18/12.  Marland Kitchen Flu shot given 10/17/12.  Reviewed Health History In EMR: Yes  Reviewed Medications In EMR: Yes  Reviewed Allergies In EMR: Yes  Date of Onset of Symptoms: 10/18/2012  Treatments Tried: Zinc , throat spray, hot tea  Treatments Tried Worked: No OB:  LMP: 10/12/2012  Guideline(s) Used:  Sore Throat  Disposition Per Guideline:   See Today in Office  Reason For Disposition Reached:   Earache also present  Advice Given:  For Relief of Sore Throat Pain:  Sip warm chicken broth or apple juice.  Suck on hard candy or a throat lozenge (over-the-counter).  Avoid cigarette smoke.  For Relief of Sore Throat Pain:  Sip warm chicken broth or apple juice.  Suck on hard candy or a throat lozenge (over-the-counter).  Gargle warm salt water 3 times daily (1 teaspoon of salt in 8 oz or 240 ml of warm water).  Avoid cigarette smoke.  Pain Medicines:  For pain relief, you can take either acetaminophen, ibuprofen, or naproxen.  They are over-the-counter (OTC) pain drugs. You can buy them at the drugstore.  Fever Medicines:  For fevers above 101 F (38.3 C) take either acetaminophen or ibuprofen.  They are over-the-counter (OTC) drugs that help treat both fever and pain. You can buy them at the drugstore.  The goal of fever therapy is to bring the fever down to a comfortable level. Remember that fever medicine usually lowers fever 2 degrees F (1 - 1 1/2 degrees C).  Soft Diet:   Cold drinks and milk shakes are especially good (Reason: swollen tonsils can make some foods hard to swallow).  Liquids:  Adequate liquid intake is important to prevent dehydration. Drink 6-8 glasses of water per day.  Contagiousness:   You can return to  work or school after the fever is gone and you feel well enough to participate in normal activities. If your doctor determines that you have Strep throat, then you will need to take an antibiotic for 24 hours before you can return.  Call Back If:  Sore throat is the main symptom and it lasts longer than 24 hours  Sore throat is mild but lasts longer than 4 days  Fever lasts longer than 3 days  You become worse.  Expected Course:  Sore throats with viral illnesses usually last 3 or 4 days.  Office Follow Up:  Does the office need to follow up with this patient?: No  Instructions For The Office: N/A  Appointment Scheduled:  10/19/2012 14:15:00

## 2012-10-19 NOTE — Telephone Encounter (Signed)
Pt has pending appt for 2:15 today

## 2012-10-19 NOTE — Patient Instructions (Addendum)
This appears to be a viral illness There is a Zpack at your pharmacy if your symptoms should change or worsen over the weekend Ibuprofen as needed for pain/fever Drink plenty of fluids REST! Hang in there! Happy Holidays!

## 2012-10-24 ENCOUNTER — Telehealth: Payer: Self-pay | Admitting: Family Medicine

## 2012-10-24 MED ORDER — CLARITHROMYCIN ER 500 MG PO TB24: 1000.0000 mg | ORAL_TABLET | Freq: Every day | ORAL | Status: DC

## 2012-10-24 NOTE — Telephone Encounter (Signed)
Patient Information:  Caller Name: Carolyn Hebert  Phone: [phone]  Patient: Carolyn Hebert  Gender: Female  DOB: 11/24/1976  Age: 36 Years  PCP: Lelon Perla.  Pregnant: No   Symptoms  Reason For Call & Symptoms: seen in office 10/19/12 for sore throat; took zpack but sinuses continue to hurt and are worse on arising AM 10/24/12; states in the past, Avelox works best for her.  Reviewed Health History In EMR: Yes  Reviewed Medications In EMR: Yes  Reviewed Allergies In EMR: Yes  Date of Onset of Symptoms: 10/18/2012 OB:  LMP: Unknown  Guideline(s) Used:  Sinus Pain and Congestion  Disposition Per Guideline:   See Today or Tomorrow in Office  Reason For Disposition Reached:   Using nasal washes and pain medicine > 24 hours and sinus pain (lower forehead, cheekbone, or eye) persists  Advice Given:  N/A  Office Follow Up:  Does the office need to follow up with this patient?: Yes  Instructions For The Office: No appts available; leaving for flight to [place] 1100 10/24/12.  Wants Rx for Avelox called in krs/can  RN Note:  No appts available in Epic 10/24/12 AM; patient leaving for flight to [place] 1100 10/24/12; would like Rx for Avelox called in.  Info to office for staff/provider review/callback.  Uses [facility]. May reach patient [phone].

## 2012-10-24 NOTE — Telephone Encounter (Signed)
Patient calling back, she leaves in 30 minutes and wants to know if this can be called in for her?  I advised that MD is in seeing patients and unsure when it will be taken care of.

## 2012-10-24 NOTE — Telephone Encounter (Signed)
She already had avelox within the month---that should have taken care of it ---- it may not be bacterial.   What symptoms is she still having.

## 2012-10-24 NOTE — Telephone Encounter (Signed)
Patient is still having the same symptoms, she said the symptoms went away and came back and she was given a Z-pak on 10/19/12 with no relief, previous symptoms did improve on Avelox. She denied fever.     KP

## 2012-10-24 NOTE — Telephone Encounter (Signed)
Discuss with patient, Rx sent. 

## 2012-10-24 NOTE — Telephone Encounter (Signed)
Stop z pack--- biaxin xl 2 po qd for 7 days  Ov if no better She should be using steroid nasal spray as well.  If she does not have one---flonase  2 sprays each nostril qd  #1  2 rfills

## 2012-10-29 NOTE — Assessment & Plan Note (Signed)
New.  Suspect this is viral as sxs just started and no evidence of bacterial infxn.  Will send Zpack to pharmacy for pt if sxs change or worsen over the weekend.  Reviewed supportive care and red flags that should prompt return.  Pt expressed understanding and is in agreement w/ plan.

## 2012-11-02 ENCOUNTER — Ambulatory Visit (INDEPENDENT_AMBULATORY_CARE_PROVIDER_SITE_OTHER): Payer: Managed Care, Other (non HMO) | Admitting: Internal Medicine

## 2012-11-02 ENCOUNTER — Encounter: Payer: Self-pay | Admitting: Internal Medicine

## 2012-11-02 VITALS — BP 128/90 | HR 71 | Temp 98.2°F | Wt 182.4 lb

## 2012-11-02 DIAGNOSIS — J329 Chronic sinusitis, unspecified: Secondary | ICD-10-CM

## 2012-11-02 DIAGNOSIS — Z8709 Personal history of other diseases of the respiratory system: Secondary | ICD-10-CM | POA: Insufficient documentation

## 2012-11-02 MED ORDER — FLUTICASONE PROPIONATE 50 MCG/ACT NA SUSP: 1.0000 | Freq: Two times a day (BID) | NASAL | Status: DC | PRN

## 2012-11-02 MED ORDER — METRONIDAZOLE 500 MG PO TABS: 500.0000 mg | ORAL_TABLET | Freq: Three times a day (TID) | ORAL | Status: DC

## 2012-11-02 NOTE — Patient Instructions (Addendum)
Plain Mucinex for thick secretions ;force NON dairy fluids . Use a Neti pot daily as needed for sinus congestion; going from open side to congested side . Nasal cleansing in the shower as discussed. Make sure that all residual soap is removed to prevent irritation. Fluticasone 1 spray in each nostril twice a day as needed. Use the "crossover" technique as discussed. Plain Allegra or Claritin daily as needed for itchy eyes & sneezing.  Avoid alcohol while on metronidazole

## 2012-11-02 NOTE — Progress Notes (Signed)
  Subjective:    Patient ID: Carolyn Hebert, female    DOB: 08-17-1976, 36 y.o.   MRN: [id number]  HPI  She has a complicated chronic history of chronic, recurrent sinusitis. She had ethmoidectomy and antrostomy with septoplasty in 2011 in [place], [place].  She was treated with Diflucan 11/2011 as a trial for possible fungal etiology  She was diagnosed as having a viral upper respiratory infection 11/22; progression of symptoms resulted prescription for Zithromax 11/25. Because of malaise and persistent symptoms Biaxin was prescribed 11/27 for 7 days.  Significantly she has a 74-year-old who has recurrent upper respiratory tract symptoms     Review of Systems  She is not having significant frontal headache, facial pain, fever, or chills. She is having some purulence from the nares and fatigue     Objective:   Physical Exam General appearance:good health ;well nourished; no acute distress or increased work of breathing is present.  No  lymphadenopathy about the head, neck, or axilla noted.   Eyes: No conjunctival inflammation or lid edema is present.   Ears:  External ear exam shows no significant lesions or deformities.  Otoscopic examination reveals clear canals, tympanic membranes are intact bilaterally without bulging, retraction, inflammation or discharge.  Nose:  External nasal examination shows no deformity or inflammation. Nasal mucosa are dry  without lesions or exudates. No septal dislocation or deviation.No obstruction to airflow.   Oral exam: Dental hygiene is good; lips and gums are healthy appearing.There is no oropharyngeal erythema or exudate noted.   Neck:  No deformities,  masses, or tenderness noted.   Supple with full range of motion without pain.   Heart:  Normal rate and regular rhythm. S1 and S2 normal without gallop, murmur, click, rub or other extra sounds.   Lungs:Chest clear to auscultation; no wheezes, rhonchi,rales ,or rubs present.No increased work of  breathing.    Extremities:  No cyanosis, edema, or clubbing  noted    Skin: Warm & dry          Assessment & Plan:  #1 recurrent sinusitis; status post Zithromax and Biaxin.  Plan aggressive nasal hygiene discussed. Metronidazole to cover anaerobes

## 2012-11-06 ENCOUNTER — Ambulatory Visit (INDEPENDENT_AMBULATORY_CARE_PROVIDER_SITE_OTHER): Payer: Managed Care, Other (non HMO) | Admitting: Family Medicine

## 2012-11-06 ENCOUNTER — Encounter: Payer: Self-pay | Admitting: Family Medicine

## 2012-11-06 ENCOUNTER — Telehealth: Payer: Self-pay | Admitting: Family Medicine

## 2012-11-06 VITALS — BP 136/88 | HR 86 | Temp 97.6°F | Wt 181.4 lb

## 2012-11-06 DIAGNOSIS — J45909 Unspecified asthma, uncomplicated: Secondary | ICD-10-CM | POA: Insufficient documentation

## 2012-11-06 DIAGNOSIS — J40 Bronchitis, not specified as acute or chronic: Secondary | ICD-10-CM

## 2012-11-06 DIAGNOSIS — J45901 Unspecified asthma with (acute) exacerbation: Secondary | ICD-10-CM

## 2012-11-06 MED ORDER — METHYLPREDNISOLONE ACETATE 80 MG/ML IJ SUSP
80.0000 mg | Freq: Once | INTRAMUSCULAR | Status: AC
  Administered 2012-11-06: 80 mg via INTRAMUSCULAR

## 2012-11-06 MED ORDER — AZITHROMYCIN 250 MG PO TABS: ORAL_TABLET | ORAL | Status: DC

## 2012-11-06 MED ORDER — PREDNISONE 10 MG PO TABS: ORAL_TABLET | ORAL | Status: DC

## 2012-11-06 MED ORDER — ALBUTEROL SULFATE (2.5 MG/3ML) 0.083% IN NEBU
2.5000 mg | INHALATION_SOLUTION | Freq: Once | RESPIRATORY_TRACT | Status: AC
  Administered 2012-11-06: 2.5 mg via RESPIRATORY_TRACT

## 2012-11-06 NOTE — Telephone Encounter (Signed)
Patient Information:  Caller Name: Sabella  Phone: [phone]  Patient: Carolyn Hebert  Gender: Female  DOB: 1975-12-04  Age: 36 Years  PCP: Lelon Perla.  Pregnant: No   Symptoms  Reason For Call & Symptoms: Wet deep chest cough - mildly productive, achy, fatigued, sinus congestion  Reviewed Health History In EMR: Yes  Reviewed Medications In EMR: Yes  Reviewed Allergies In EMR: Yes  Reviewed Surgeries / Procedures: Yes  Date of Onset of Symptoms: 11/05/2012  Treatments Tried: Neti pot, flonase, rest, tea, fluids  Treatments Tried Worked: No  Any Fever: Yes  Fever Taken: Tactile  Fever Time Of Reading: 08:46:00  Fever Last Reading: N/A OB:  LMP: 10/18/2012  Guideline(s) Used:  Cough  Disposition Per Guideline:   See Today or Tomorrow in Office  Reason For Disposition Reached:   Patient wants to be seen  Advice Given:  Prevent Dehydration:  Drink adequate liquids.  Call Back If:  Difficulty breathing occurs  You become worse  Office Follow Up:  Does the office need to follow up with this patient?: No  Instructions For The Office: N/A  Appointment Scheduled:  11/06/2012 15:00:00 Appointment Scheduled Provider:  Lelon Perla  RN Note:  Patient states that she is prone to Bronchitis and has progressed and became worse since previous office visit and would like to be seen again and re-examined.  Has not taken the Mucinex as instructed at last visit do to overlooking the instructions however states has done everything else that provider recommended.

## 2012-11-06 NOTE — Patient Instructions (Addendum)
Asthma Attack Prevention  HOW CAN ASTHMA BE PREVENTED?  Currently, there is no way to prevent asthma from starting. However, you can take steps to control the disease and prevent its symptoms after you have been diagnosed. Learn about your asthma and how to control it. Take an active role to control your asthma by working with your caregiver to create and follow an asthma action plan. An asthma action plan guides you in taking your medicines properly, avoiding factors that make your asthma worse, tracking your level of asthma control, responding to worsening asthma, and seeking emergency care when needed. To track your asthma, keep records of your symptoms, check your peak flow number using a peak flow meter (handheld device that shows how well air moves out of your lungs), and get regular asthma checkups.   Other ways to prevent asthma attacks include:   Use medicines as your caregiver directs.   Identify and avoid things that make your asthma worse (as much as you can).   Keep track of your asthma symptoms and level of control.   Get regular checkups for your asthma.   With your caregiver, write a detailed plan for taking medicines and managing an asthma attack. Then be sure to follow your action plan. Asthma is an ongoing condition that needs regular monitoring and treatment.   Identify and avoid asthma triggers. A number of outdoor allergens and irritants (pollen, mold, cold air, air pollution) can trigger asthma attacks. Find out what causes or makes your asthma worse, and take steps to avoid those triggers (see below).   Monitor your breathing. Learn to recognize warning signs of an attack, such as slight coughing, wheezing or shortness of breath. However, your lung function may already decrease before you notice any signs or symptoms, so regularly measure and record your peak airflow with a home peak flow meter.   Identify and treat attacks early. If you act quickly, you're less likely to have a  severe attack. You will also need less medicine to control your symptoms. When your peak flow measurements decrease and alert you to an upcoming attack, take your medicine as instructed, and immediately stop any activity that may have triggered the attack. If your symptoms do not improve, get medical help.   Pay attention to increasing quick-relief inhaler use. If you find yourself relying on your quick-relief inhaler (such as albuterol), your asthma is not under control. See your caregiver about adjusting your treatment.  IDENTIFY AND CONTROL FACTORS THAT MAKE YOUR ASTHMA WORSE  A number of common things can set off or make your asthma symptoms worse (asthma triggers). Keep track of your asthma symptoms for several weeks, detailing all the environmental and emotional factors that are linked with your asthma. When you have an asthma attack, go back to your asthma diary to see which factor, or combination of factors, might have contributed to it. Once you know what these factors are, you can take steps to control many of them.   Allergies: If you have allergies and asthma, it is important to take asthma prevention steps at home. Asthma attacks (worsening of asthma symptoms) can be triggered by allergies, which can cause temporary increased inflammation of your airways. Minimizing contact with the substance to which you are allergic will help prevent an asthma attack.  Animal Dander:    Some people are allergic to the flakes of skin or dried saliva from animals with fur or feathers. Keep these pets out of your home.   If   you can't keep a pet outdoors, keep the pet out of your bedroom and other sleeping areas at all times, and keep the door closed.   Remove carpets and furniture covered with cloth from your home. If that is not possible, keep the pet away from fabric-covered furniture and carpets.  Dust Mites:   Many people with asthma are allergic to dust mites. Dust mites are tiny bugs that are found in every  home, in mattresses, pillows, carpets, fabric-covered furniture, bedcovers, clothes, stuffed toys, fabric, and other fabric-covered items.   Cover your mattress in a special dust-proof cover.   Cover your pillow in a special dust-proof cover, or wash the pillow each week in hot water. Water must be hotter than 130 F to kill dust mites. Cold or warm water used with detergent and bleach can also be effective.   Wash the sheets and blankets on your bed each week in hot water.   Try not to sleep or lie on cloth-covered cushions.   Call ahead when traveling and ask for a smoke-free hotel room. Bring your own bedding and pillows, in case the hotel only supplies feather pillows and down comforters, which may contain dust mites and cause asthma symptoms.   Remove carpets from your bedroom and those laid on concrete, if you can.   Keep stuffed toys out of the bed, or wash the toys weekly in hot water or cooler water with detergent and bleach.  Cockroaches:   Many people with asthma are allergic to the droppings and remains of cockroaches.   Keep food and garbage in closed containers. Never leave food out.   Use poison baits, traps, powders, gels, or paste (for example, boric acid).   If a spray is used to kill cockroaches, stay out of the room until the odor goes away.  Indoor Mold:   Fix leaky faucets, pipes, or other sources of water that have mold around them.   Clean moldy surfaces with a cleaner that has bleach in it.  Pollen and Outdoor Mold:   When pollen or mold spore counts are high, try to keep your windows closed.   Stay indoors with windows closed from late morning to afternoon, if you can. Pollen and some mold spore counts are highest at that time.   Ask your caregiver whether you need to take or increase anti-inflammatory medicine before your allergy season starts.  Irritants:    Tobacco smoke is an irritant. If you smoke, ask your caregiver how you can quit. Ask family members to quit  smoking, too. Do not allow smoking in your home or car.   If possible, do not use a wood-burning stove, kerosene heater, or fireplace. Minimize exposure to all sources of smoke, including incense, candles, fires, and fireworks.   Try to stay away from strong odors and sprays, such as perfume, talcum powder, hair spray, and paints.   Decrease humidity in your home and use an indoor air cleaning device. Reduce indoor humidity to below 60 percent. Dehumidifiers or central air conditioners can do this.   Try to have someone else vacuum for you once or twice a week, if you can. Stay out of rooms while they are being vacuumed and for a short while afterward.   If you vacuum, use a dust mask from a hardware store, a double-layered or microfilter vacuum cleaner bag, or a vacuum cleaner with a HEPA filter.   Sulfites in foods and beverages can be irritants. Do not drink beer or   wine, or eat dried fruit, processed potatoes, or shrimp if they cause asthma symptoms.   Cold air can trigger an asthma attack. Cover your nose and mouth with a scarf on cold or windy days.   Several health conditions can make asthma more difficult to manage, including runny nose, sinus infections, reflux disease, psychological stress, and sleep apnea. Your caregiver will treat these conditions, as well.   Avoid close contact with people who have a cold or the flu, since your asthma symptoms may get worse if you catch the infection from them. Wash your hands thoroughly after touching items that may have been handled by people with a respiratory infection.   Get a flu shot every year to protect against the flu virus, which often makes asthma worse for days or weeks. Also get a pneumonia shot once every five to 10 years.  Drugs:   Aspirin and other painkillers can cause asthma attacks. 10% to 20% of people with asthma have sensitivity to aspirin or a group of painkillers called non-steroidal anti-inflammatory drugs (NSAIDS), such as ibuprofen  and naproxen. These drugs are used to treat pain and reduce fevers. Asthma attacks caused by any of these medicines can be severe and even fatal. These drugs must be avoided in people who have known aspirin sensitive asthma. Products with acetaminophen are considered safe for people who have asthma. It is important that people with aspirin sensitivity read labels of all over-the-counter drugs used to treat pain, colds, coughs, and fever.   Beta blockers and ACE inhibitors are other drugs which you should discuss with your caregiver, in relation to your asthma.  ALLERGY SKIN TESTING   Ask your asthma caregiver about allergy skin testing or blood testing (RAST test) to identify the allergens to which you are sensitive. If you are found to have allergies, allergy shots (immunotherapy) for asthma may help prevent future allergies and asthma. With allergy shots, small doses of allergens (substances to which you are allergic) are injected under your skin on a regular schedule. Over a period of time, your body may become used to the allergen and less responsive with asthma symptoms. You can also take measures to minimize your exposure to those allergens.  EXERCISE   If you have exercise-induced asthma, or are planning vigorous exercise, or exercise in cold, humid, or dry environments, prevent exercise-induced asthma by following your caregiver's advice regarding asthma treatment before exercising.  Document Released: 11/02/2009 Document Revised: 02/06/2012 Document Reviewed: 11/02/2009  [employer] Patient Information 2013 [employer].

## 2012-11-06 NOTE — Progress Notes (Signed)
  Subjective:     Carolyn Hebert is a 36 y.o. female here for evaluation of a cough. Onset of symptoms was several weeks ago. Symptoms have been gradually worsening since that time. The cough is barky and dry and is aggravated by exercise, infection, pollens and reclining position. Associated symptoms include: shortness of breath and wheezing. Patient does have a history of asthma. Patient does not have a history of environmental allergens. Patient has not traveled recently. Patient does not have a history of smoking. Patient has not had a previous chest x-ray. Patient has not had a PPD done.  The following portions of the patient's history were reviewed and updated as appropriate: allergies, current medications, past family history, past medical history, past social history, past surgical history and problem list.  Review of Systems Pertinent items are noted in HPI.    Objective:    Oxygen saturation 99% on room air BP 136/88  Pulse 86  Temp 97.6 F (36.4 C) (Oral)  Wt 181 lb 6.4 oz (82.283 kg)  SpO2 99% General appearance: alert, cooperative, appears stated age and no distress Ears: normal TM's and external ear canals both ears Nose: green discharge, moderate congestion, turbinates red, swollen, sinus tenderness bilateral Throat: abnormal findings: + errythema Neck: marked anterior cervical adenopathy, supple, symmetrical, trachea midline and thyroid not enlarged, symmetric, no tenderness/mass/nodules Lungs: diminished breath sounds bilaterally Heart: S1, S2 normal    Assessment:    Acute Bronchitis and Asthma    Plan:    Antibiotics per medication orders. Antitussives per medication orders. Avoid exposure to tobacco smoke and fumes. B-agonist inhaler. Call if shortness of breath worsens, blood in sputum, change in character of cough, development of fever or chills, inability to maintain nutrition and hydration. Avoid exposure to tobacco smoke and fumes. Steroid inhaler as  ordered.

## 2012-11-06 NOTE — Addendum Note (Signed)
Addended by: Arnette Norris on: 11/06/2012 05:02 PM   Modules accepted: Orders

## 2012-11-07 ENCOUNTER — Other Ambulatory Visit: Payer: Self-pay | Admitting: *Deleted

## 2012-11-07 NOTE — Telephone Encounter (Signed)
avelox 400 mg  #5 1 po qd  Ok to refill ventolin --- #1   2 refills   2 puffs qid prn

## 2012-11-07 NOTE — Telephone Encounter (Signed)
Pt called triage, wants azithromycin changed to something that her stomach can tolerate better. Also need refill for Ventolin Inhaler 

## 2012-11-08 MED ORDER — MOXIFLOXACIN HCL 400 MG PO TABS: 400.0000 mg | ORAL_TABLET | Freq: Every day | ORAL | Status: DC

## 2012-11-08 MED ORDER — ALBUTEROL SULFATE HFA 108 (90 BASE) MCG/ACT IN AERS: 2.0000 | INHALATION_SPRAY | Freq: Four times a day (QID) | RESPIRATORY_TRACT | Status: DC

## 2012-11-08 NOTE — Telephone Encounter (Signed)
Detailed msg left advising Medications faxed to the pharmacy on file...   KP

## 2012-11-13 ENCOUNTER — Encounter: Payer: Self-pay | Admitting: Family Medicine

## 2012-11-13 ENCOUNTER — Ambulatory Visit (INDEPENDENT_AMBULATORY_CARE_PROVIDER_SITE_OTHER): Payer: Managed Care, Other (non HMO) | Admitting: Family Medicine

## 2012-11-13 VITALS — BP 110/68 | HR 91 | Temp 99.1°F | Wt 179.6 lb

## 2012-11-13 DIAGNOSIS — J45909 Unspecified asthma, uncomplicated: Secondary | ICD-10-CM

## 2012-11-13 DIAGNOSIS — J45901 Unspecified asthma with (acute) exacerbation: Secondary | ICD-10-CM

## 2012-11-13 MED ORDER — BECLOMETHASONE DIPROPIONATE 80 MCG/ACT IN AERS: 2.0000 | INHALATION_SPRAY | Freq: Two times a day (BID) | RESPIRATORY_TRACT | Status: DC

## 2012-11-13 MED ORDER — ALBUTEROL SULFATE (2.5 MG/3ML) 0.083% IN NEBU: 2.5000 mg | INHALATION_SOLUTION | Freq: Four times a day (QID) | RESPIRATORY_TRACT | Status: DC | PRN

## 2012-11-13 MED ORDER — ALBUTEROL SULFATE (2.5 MG/3ML) 0.083% IN NEBU
2.5000 mg | INHALATION_SOLUTION | Freq: Once | RESPIRATORY_TRACT | Status: AC
Start: 2012-11-13 — End: 2012-11-13
  Administered 2012-11-13: 2.5 mg via RESPIRATORY_TRACT

## 2012-11-13 NOTE — Progress Notes (Signed)
Subjective:     Carolyn Hebert is an 36 y.o. female who presents for follow up of asthma. The patient is currently having symptoms / an exacerbation. Current symptoms include dyspnea, non-productive cough and wheezing. Symptoms have been present since last visit and have been gradually worsening. She denies chest pain. Associated symptoms include poor exercise tolerance, shortness of breath and wheezing.  This episode appears to have been triggered by pollens. Treatments tried for the current exacerbation include short-acting inhaled beta-adrenergic agonists and systemic corticosteroids, which have provided some relief of symptoms. The patient has been having similar episodes for approximately several  years.  Current Disease Severity Carolyn Hebert has frequent daytime asthma symptoms. She has frequent nighttime asthma symptoms. The patient is using short-acting beta agonists for symptom control 2x a day. She has exacerbations requiring oral systemic corticosteroids 1 times per year. Number of days of school or work missed in the last month: 2. Number of urgent/emergent visit in last year: 0   The following portions of the patient's history were reviewed and updated as appropriate:  She  has a past medical history of Ovarian cyst; LGSIL (low grade squamous intraepithelial lesion) on Pap smear; Anxiety; Depression; IBS (irritable bowel syndrome); Dysplastic nevus; and HSV-2 infection. She  does not have any pertinent problems on file. She  has past surgical history that includes Cystectomy; Cesarean section; laparoscopy; Wisdom tooth extraction; and Septoplasty with ethmoidectomy, and maxillary antrostomy (2011). Her family history includes Cancer in her maternal aunt and maternal grandfather; Hyperlipidemia in her mother; and Hypertension in her father. She  reports that she has never smoked. She has never used smokeless tobacco. She reports that she drinks alcohol. She reports that she does not use illicit  drugs. She has a current medication list which includes the following prescription(s): albuterol, desogestrel-ethinyl estradiol, escitalopram, fluticasone, metronidazole, montelukast, moxifloxacin, olopatadine hcl, prednisone, albuterol, and beclomethasone. Current Outpatient Prescriptions on File Prior to Visit  Medication Sig Dispense Refill  . albuterol (PROVENTIL HFA;VENTOLIN HFA) 108 (90 BASE) MCG/ACT inhaler Inhale 2 puffs into the lungs 4 (four) times daily.  1 Inhaler  2  . desogestrel-ethinyl estradiol (KARIVA,AZURETTE,MIRCETTE) 0.15-0.02/0.01 MG (21/5) tablet Take 1 tablet by mouth daily.  3 Package  3  . escitalopram (LEXAPRO) 20 MG tablet Take 1 tablet (20 mg total) by mouth daily.  30 tablet  0  . fluticasone (FLONASE) 50 MCG/ACT nasal spray Place 1 spray into the nose 2 (two) times daily as needed for rhinitis.  16 g  2  . metroNIDAZOLE (FLAGYL) 500 MG tablet Take 1 tablet (500 mg total) by mouth 3 (three) times daily.  21 tablet  0  . montelukast (SINGULAIR) 10 MG tablet Take 10 mg by mouth at bedtime.      . moxifloxacin (AVELOX) 400 MG tablet Take 1 tablet (400 mg total) by mouth daily.  5 tablet  0  . Olopatadine HCl 0.6 % SOLN 2 sprays each nostril qd      . predniSONE (DELTASONE) 10 MG tablet 3 po qd for 3 days then 2 po qd for 3 days the 1 po qd for 3 days  18 tablet  0  . beclomethasone (QVAR) 80 MCG/ACT inhaler Inhale 2 puffs into the lungs 2 (two) times daily.  1 Inhaler  12   She is allergic to penicillins..  Review of Systems Pertinent items are noted in HPI.    Objective:    Oxygen saturation 97% on room air BP 110/68  Pulse 91  Temp  99.1 F (37.3 C) (Oral)  Wt 179 lb 9.6 oz (81.466 kg)  SpO2 97% General appearance: alert, cooperative, appears stated age and no distress Ears: normal TM's and external ear canals both ears Nose: Nares normal. Septum midline. Mucosa normal. No drainage or sinus tenderness. Throat: lips, mucosa, and tongue normal; teeth and gums  normal Neck: no adenopathy, supple, symmetrical, trachea midline and thyroid not enlarged, symmetric, no tenderness/mass/nodules Lungs: wheezes bilaterally Heart: S1, S2 normal    Assessment:    Severe persistent asthma, ongoing.     Plan:    Medications: qvar and albuterol---new neb given to pt. Beta-agonist nebulizer treatment given in the office with some relief of symptoms. Discussed medication dosage, use, side effects, and goals of treatment in detail.   Asthma information handout given. Chest x-ray Referral to asthma specialist pulmonary

## 2012-11-13 NOTE — Patient Instructions (Signed)
Asthma Attack Prevention  HOW CAN ASTHMA BE PREVENTED?  Currently, there is no way to prevent asthma from starting. However, you can take steps to control the disease and prevent its symptoms after you have been diagnosed. Learn about your asthma and how to control it. Take an active role to control your asthma by working with your caregiver to create and follow an asthma action plan. An asthma action plan guides you in taking your medicines properly, avoiding factors that make your asthma worse, tracking your level of asthma control, responding to worsening asthma, and seeking emergency care when needed. To track your asthma, keep records of your symptoms, check your peak flow number using a peak flow meter (handheld device that shows how well air moves out of your lungs), and get regular asthma checkups.   Other ways to prevent asthma attacks include:   Use medicines as your caregiver directs.   Identify and avoid things that make your asthma worse (as much as you can).   Keep track of your asthma symptoms and level of control.   Get regular checkups for your asthma.   With your caregiver, write a detailed plan for taking medicines and managing an asthma attack. Then be sure to follow your action plan. Asthma is an ongoing condition that needs regular monitoring and treatment.   Identify and avoid asthma triggers. A number of outdoor allergens and irritants (pollen, mold, cold air, air pollution) can trigger asthma attacks. Find out what causes or makes your asthma worse, and take steps to avoid those triggers (see below).   Monitor your breathing. Learn to recognize warning signs of an attack, such as slight coughing, wheezing or shortness of breath. However, your lung function may already decrease before you notice any signs or symptoms, so regularly measure and record your peak airflow with a home peak flow meter.   Identify and treat attacks early. If you act quickly, you're less likely to have a  severe attack. You will also need less medicine to control your symptoms. When your peak flow measurements decrease and alert you to an upcoming attack, take your medicine as instructed, and immediately stop any activity that may have triggered the attack. If your symptoms do not improve, get medical help.   Pay attention to increasing quick-relief inhaler use. If you find yourself relying on your quick-relief inhaler (such as albuterol), your asthma is not under control. See your caregiver about adjusting your treatment.  IDENTIFY AND CONTROL FACTORS THAT MAKE YOUR ASTHMA WORSE  A number of common things can set off or make your asthma symptoms worse (asthma triggers). Keep track of your asthma symptoms for several weeks, detailing all the environmental and emotional factors that are linked with your asthma. When you have an asthma attack, go back to your asthma diary to see which factor, or combination of factors, might have contributed to it. Once you know what these factors are, you can take steps to control many of them.   Allergies: If you have allergies and asthma, it is important to take asthma prevention steps at home. Asthma attacks (worsening of asthma symptoms) can be triggered by allergies, which can cause temporary increased inflammation of your airways. Minimizing contact with the substance to which you are allergic will help prevent an asthma attack.  Animal Dander:    Some people are allergic to the flakes of skin or dried saliva from animals with fur or feathers. Keep these pets out of your home.   If   you can't keep a pet outdoors, keep the pet out of your bedroom and other sleeping areas at all times, and keep the door closed.   Remove carpets and furniture covered with cloth from your home. If that is not possible, keep the pet away from fabric-covered furniture and carpets.  Dust Mites:   Many people with asthma are allergic to dust mites. Dust mites are tiny bugs that are found in every  home, in mattresses, pillows, carpets, fabric-covered furniture, bedcovers, clothes, stuffed toys, fabric, and other fabric-covered items.   Cover your mattress in a special dust-proof cover.   Cover your pillow in a special dust-proof cover, or wash the pillow each week in hot water. Water must be hotter than 130 F to kill dust mites. Cold or warm water used with detergent and bleach can also be effective.   Wash the sheets and blankets on your bed each week in hot water.   Try not to sleep or lie on cloth-covered cushions.   Call ahead when traveling and ask for a smoke-free hotel room. Bring your own bedding and pillows, in case the hotel only supplies feather pillows and down comforters, which may contain dust mites and cause asthma symptoms.   Remove carpets from your bedroom and those laid on concrete, if you can.   Keep stuffed toys out of the bed, or wash the toys weekly in hot water or cooler water with detergent and bleach.  Cockroaches:   Many people with asthma are allergic to the droppings and remains of cockroaches.   Keep food and garbage in closed containers. Never leave food out.   Use poison baits, traps, powders, gels, or paste (for example, boric acid).   If a spray is used to kill cockroaches, stay out of the room until the odor goes away.  Indoor Mold:   Fix leaky faucets, pipes, or other sources of water that have mold around them.   Clean moldy surfaces with a cleaner that has bleach in it.  Pollen and Outdoor Mold:   When pollen or mold spore counts are high, try to keep your windows closed.   Stay indoors with windows closed from late morning to afternoon, if you can. Pollen and some mold spore counts are highest at that time.   Ask your caregiver whether you need to take or increase anti-inflammatory medicine before your allergy season starts.  Irritants:    Tobacco smoke is an irritant. If you smoke, ask your caregiver how you can quit. Ask family members to quit  smoking, too. Do not allow smoking in your home or car.   If possible, do not use a wood-burning stove, kerosene heater, or fireplace. Minimize exposure to all sources of smoke, including incense, candles, fires, and fireworks.   Try to stay away from strong odors and sprays, such as perfume, talcum powder, hair spray, and paints.   Decrease humidity in your home and use an indoor air cleaning device. Reduce indoor humidity to below 60 percent. Dehumidifiers or central air conditioners can do this.   Try to have someone else vacuum for you once or twice a week, if you can. Stay out of rooms while they are being vacuumed and for a short while afterward.   If you vacuum, use a dust mask from a hardware store, a double-layered or microfilter vacuum cleaner bag, or a vacuum cleaner with a HEPA filter.   Sulfites in foods and beverages can be irritants. Do not drink beer or   wine, or eat dried fruit, processed potatoes, or shrimp if they cause asthma symptoms.   Cold air can trigger an asthma attack. Cover your nose and mouth with a scarf on cold or windy days.   Several health conditions can make asthma more difficult to manage, including runny nose, sinus infections, reflux disease, psychological stress, and sleep apnea. Your caregiver will treat these conditions, as well.   Avoid close contact with people who have a cold or the flu, since your asthma symptoms may get worse if you catch the infection from them. Wash your hands thoroughly after touching items that may have been handled by people with a respiratory infection.   Get a flu shot every year to protect against the flu virus, which often makes asthma worse for days or weeks. Also get a pneumonia shot once every five to 10 years.  Drugs:   Aspirin and other painkillers can cause asthma attacks. 10% to 20% of people with asthma have sensitivity to aspirin or a group of painkillers called non-steroidal anti-inflammatory drugs (NSAIDS), such as ibuprofen  and naproxen. These drugs are used to treat pain and reduce fevers. Asthma attacks caused by any of these medicines can be severe and even fatal. These drugs must be avoided in people who have known aspirin sensitive asthma. Products with acetaminophen are considered safe for people who have asthma. It is important that people with aspirin sensitivity read labels of all over-the-counter drugs used to treat pain, colds, coughs, and fever.   Beta blockers and ACE inhibitors are other drugs which you should discuss with your caregiver, in relation to your asthma.  ALLERGY SKIN TESTING   Ask your asthma caregiver about allergy skin testing or blood testing (RAST test) to identify the allergens to which you are sensitive. If you are found to have allergies, allergy shots (immunotherapy) for asthma may help prevent future allergies and asthma. With allergy shots, small doses of allergens (substances to which you are allergic) are injected under your skin on a regular schedule. Over a period of time, your body may become used to the allergen and less responsive with asthma symptoms. You can also take measures to minimize your exposure to those allergens.  EXERCISE   If you have exercise-induced asthma, or are planning vigorous exercise, or exercise in cold, humid, or dry environments, prevent exercise-induced asthma by following your caregiver's advice regarding asthma treatment before exercising.  Document Released: 11/02/2009 Document Revised: 02/06/2012 Document Reviewed: 11/02/2009  [employer] Patient Information 2013 [employer].

## 2012-11-14 ENCOUNTER — Encounter: Payer: Self-pay | Admitting: Family Medicine

## 2012-11-29 ENCOUNTER — Encounter: Payer: Self-pay | Admitting: Pulmonary Disease

## 2012-11-29 ENCOUNTER — Ambulatory Visit (INDEPENDENT_AMBULATORY_CARE_PROVIDER_SITE_OTHER): Payer: Managed Care, Other (non HMO) | Admitting: Pulmonary Disease

## 2012-11-29 VITALS — BP 132/78 | HR 70 | Temp 98.1°F | Ht 67.0 in | Wt 182.0 lb

## 2012-11-29 DIAGNOSIS — J45909 Unspecified asthma, uncomplicated: Secondary | ICD-10-CM

## 2012-11-29 DIAGNOSIS — J45901 Unspecified asthma with (acute) exacerbation: Secondary | ICD-10-CM

## 2012-11-29 NOTE — Progress Notes (Signed)
Subjective:    Patient ID: Carolyn Hebert, female    DOB: 1976/01/25, 37 y.o.   MRN: [id number]  HPI Keyonni is a 37 year old remote smoker who presents for evaluation of recurrent sinobronchitis and asthma. She just moved from [place] to [place] and I have reviewed records from her ENT physicians and pulmonologist in [place].  She had 4-5 primary care visits since September requiring multiple courses of antibiotics, nebulizer treatment  in the office with some relief of symptoms. and a long course of prednisone. This episode started off as a URI illness that she probably contracted from her 43-year-old daughter. She carries a history of allergic rhinitis, chronic sinusitis and bronchitis. She underwent bilateral endoscopic sinus surgery in August 2011 but continued to have recurrent sinusitis. Cultures at one point did show Streptococcus pneumonia and pseudomonas. The possibly of long-term suppressive antibiotics such as twice weekly azithromycin was considered. She was on allergy shots. IgE levels were 40 6/12 and other immunoglobulin levels were normal.  He generally requires a smaller dose but longer course of prednisone. Spirometry today showed FEV1 of 59% and FVC of 83% suggestive of moderate airway obstruction. She is maintained on Advair 500, singular and rescue inhaler She works in [employer] and denies any environmental triggers. She does have 2 dogs and a cat at home. She denies obvious heartburn, recurrent pneumonias or childhood history of asthma.      Past Medical History  Diagnosis Date  . Ovarian cyst   . LGSIL (low grade squamous intraepithelial lesion) on Pap smear   . Anxiety   . Depression   . IBS (irritable bowel syndrome)   . Dysplastic nevus   . HSV-2 infection     Past Surgical History  Procedure Date  . Cystectomy   . Cesarean section   . Laparoscopy   . Wisdom tooth extraction   . Septoplasty with ethmoidectomy, and maxillary antrostomy 2011     [place]  , [place]   Allergies  Allergen Reactions  . Penicillins     hives   History   Social History  . Marital Status: Married    Spouse Name: N/A    Number of Children: 1  . Years of Education: N/A   Occupational History  . sales    Social History Main Topics  . Smoking status: Former Smoker -- 7 years    Types: Cigarettes    Quit date: 11/29/1999  . Smokeless tobacco: Never Used     Comment: "couple of cigs on weekend"  . Alcohol Use: Yes     Comment: 2-3 beers per week  . Drug Use: No  . Sexually Active: Yes    Birth Control/ Protection: Pill   Other Topics Concern  . Not on file   Social History Narrative  . No narrative on file     Review of Systems  Constitutional: Positive for appetite change. Negative for fever and unexpected weight change.  HENT: Positive for ear pain, sore throat and sneezing. Negative for nosebleeds, congestion, rhinorrhea, trouble swallowing, dental problem, postnasal drip and sinus pressure.   Eyes: Negative for redness and itching.  Respiratory: Positive for shortness of breath. Negative for cough, chest tightness and wheezing.   Cardiovascular: Positive for chest pain. Negative for palpitations and leg swelling.  Gastrointestinal: Positive for abdominal pain. Negative for nausea and vomiting.  Genitourinary: Negative for dysuria.  Musculoskeletal: Negative for joint swelling.  Skin: Positive for rash.  Neurological: Positive for headaches.  Hematological: Does not bruise/bleed easily.  Psychiatric/Behavioral: Negative for dysphoric mood. The patient is nervous/anxious.        Objective:   Physical Exam  Gen. Pleasant, well-nourished, in no distress, normal affect ENT - no lesions, no post nasal drip Neck: No JVD, no thyromegaly, no carotid bruits Lungs: no use of accessory muscles, no dullness to percussion, clear without rales or rhonchi  Cardiovascular: Rhythm regular, heart sounds  normal, no murmurs or gallops, no peripheral  edema Abdomen: soft and non-tender, no hepatosplenomegaly, BS normal. Musculoskeletal: No deformities, no cyanosis or clubbing Neuro:  alert, non focal       Assessment & Plan:

## 2012-11-29 NOTE — Patient Instructions (Signed)
You do have airway obstruction suggestive of asthma Stay on advair 500 twice daily- RINSE mouth after use Stay on singulair Use albuterol MDI 2 puffs as needed only for rescue Obtain records- sinus imaging, allergy records etc

## 2012-11-29 NOTE — Assessment & Plan Note (Addendum)
She does seem to have airway obstruction suggestive of moderate persistent asthma vs resolving bronchitis Stay on advair 500 twice daily- RINSE mouth after use Stay on singulair Use albuterol MDI 2 puffs as needed only for rescue Possibilities in the future include step up to qvar, reassessing allergy profile. Doubt she is a candidate for Xolair given low IgE. Long-term suppressive antibiotics remain a possibility. He may also have a major sinuses and lungs for bronchiectasis if episodes continue.

## 2012-12-03 ENCOUNTER — Encounter: Payer: Self-pay | Admitting: Obstetrics and Gynecology

## 2012-12-03 ENCOUNTER — Ambulatory Visit (INDEPENDENT_AMBULATORY_CARE_PROVIDER_SITE_OTHER): Payer: Managed Care, Other (non HMO) | Admitting: Obstetrics and Gynecology

## 2012-12-03 ENCOUNTER — Encounter: Payer: Managed Care, Other (non HMO) | Admitting: Obstetrics and Gynecology

## 2012-12-03 VITALS — BP 112/64 | HR 72 | Wt 183.0 lb

## 2012-12-03 DIAGNOSIS — R102 Pelvic and perineal pain: Secondary | ICD-10-CM

## 2012-12-03 DIAGNOSIS — N926 Irregular menstruation, unspecified: Secondary | ICD-10-CM

## 2012-12-03 DIAGNOSIS — N943 Premenstrual tension syndrome: Secondary | ICD-10-CM

## 2012-12-03 DIAGNOSIS — N949 Unspecified condition associated with female genital organs and menstrual cycle: Secondary | ICD-10-CM

## 2012-12-03 LAB — POCT URINALYSIS DIPSTICK
Bilirubin, UA: NEGATIVE
Blood, UA: NEGATIVE
Glucose, UA: NEGATIVE
Ketones, UA: NEGATIVE
Spec Grav, UA: <=1.005

## 2012-12-03 NOTE — Progress Notes (Signed)
Subjective:    Carolyn Hebert is a 37 y.o. female, G1P1, who presents for a 3 month f/u pelvic pain.  All sx have improved since last visit.  Pt had single episod spotty bleeding sporadically between menses  The following portions of the patient's history were reviewed and updated as appropriate: allergies, current medications, past family history.  Review of Systems Pertinent items are noted in HPI. Breast:Negative for breast lump,nipple discharge or nipple retraction Gastrointestinal: Negative for abdominal pain, change in bowel habits or rectal bleeding Urinary:negative   Objective:    BP 112/64  Pulse 72  Wt 183 lb (83.008 kg)  LMP 11/15/2012    Weight:  Wt Readings from Last 1 Encounters:  12/03/12 183 lb (83.008 kg)          BMI: There is no height on file to calculate BMI.  General Appearance: Alert, appropriate appearance for age. No acute distress GYN exam:Pelvic exam: normal external genitalia, vulva, vagina,  uterus and adnexa.                               Cervix contained an area of contact bleeding at 3 o'clock   Assessment:  PMS with hx of chronic depression, impoved on Lexaparo Pelvic pain relieved on BCPs Plan:    Pap with reflex HPV Bleeding site on cervix treated with AgNO3 Bleeding calendar F/U for aex

## 2012-12-04 LAB — PAP IG W/ RFLX HPV ASCU

## 2012-12-24 ENCOUNTER — Telehealth: Payer: Self-pay | Admitting: Pulmonary Disease

## 2012-12-24 NOTE — Telephone Encounter (Signed)
Spoke with patient, she has faxed over forms to be compelted by Dr. Vassie Loll.  Forms are in Dr. Carlena Sax look at.    Will forward to Mindy so that she is aware.

## 2012-12-24 NOTE — Telephone Encounter (Signed)
ATC patient at number provided. No answer. [id number] 

## 2012-12-27 NOTE — Telephone Encounter (Signed)
Pt called back again re: same. What's the status? [phone]. Carolyn Hebert

## 2012-12-27 NOTE — Telephone Encounter (Signed)
Form completed & given to [facility]

## 2012-12-28 NOTE — Telephone Encounter (Signed)
Pt returned call. I advised that form was faxed. Nothing further needed per pt. Carolyn Hebert

## 2012-12-28 NOTE — Telephone Encounter (Signed)
Form was faxed over 12/27/12 to [phone] that was on fax  [id number] x1 for pt

## 2012-12-28 NOTE — Telephone Encounter (Signed)
Pt would like an update for this form & can be reached at [phone].  Antionette Fairy

## 2012-12-31 ENCOUNTER — Encounter: Payer: Self-pay | Admitting: Pulmonary Disease

## 2012-12-31 ENCOUNTER — Ambulatory Visit (INDEPENDENT_AMBULATORY_CARE_PROVIDER_SITE_OTHER): Payer: Managed Care, Other (non HMO) | Admitting: Pulmonary Disease

## 2012-12-31 VITALS — BP 126/82 | HR 74 | Temp 98.0°F | Ht 67.0 in | Wt 179.0 lb

## 2012-12-31 DIAGNOSIS — Z8709 Personal history of other diseases of the respiratory system: Secondary | ICD-10-CM

## 2012-12-31 DIAGNOSIS — J45901 Unspecified asthma with (acute) exacerbation: Secondary | ICD-10-CM

## 2012-12-31 MED ORDER — MONTELUKAST SODIUM 10 MG PO TABS: 10.0000 mg | ORAL_TABLET | Freq: Every day | ORAL | Status: DC

## 2012-12-31 NOTE — Assessment & Plan Note (Signed)
2011 S/P septoplasty &  antrostomy in [place], [place]   Triggers or exacerbating factors attributed to allergens, viral and possibly fungal infection   If recurrent sinusitis, need antibiotic x at least 14 days - (levaquin )

## 2012-12-31 NOTE — Patient Instructions (Signed)
Sample of symbicort 160 twice daily to try instead of advair to see if voice improves Refill on singulair If recurrent sinusitis, need antibiotic x at least 14 days - (levaquin ) We discussed asthma & pregnancy

## 2012-12-31 NOTE — Progress Notes (Signed)
  Subjective:    Patient ID: Carolyn Hebert, female    DOB: 1976/06/07, 37 y.o.   MRN: [id number]  HPI  37 year old remote smoker who presents for evaluation of recurrent sinobronchitis and asthma. She just moved from [place] to [place] and I have reviewed records from her ENT physicians and pulmonologist in [place].  She had 4-5 primary care visits since September requiring multiple courses of antibiotics, nebulizer treatment  in the office with some relief of symptoms. and a long course of prednisone. This episode started off as a URI illness that she probably contracted from her 60-year-old daughter. She carries a history of allergic rhinitis, chronic sinusitis and bronchitis. She underwent bilateral endoscopic sinus surgery in August 2011 but continued to have recurrent sinusitis. Cultures at one point did show Streptococcus pneumonia and pseudomonas. The possibly of long-term suppressive antibiotics such as twice weekly azithromycin was considered. She was on allergy shots. IgE levels were 40 6/12 and other immunoglobulin levels were normal.  He generally requires a smaller dose but longer course of prednisone. Spirometry today showed FEV1 of 59% and FVC of 83% suggestive of moderate airway obstruction. She is maintained on Advair 500, singular and rescue inhaler She works in [employer] and denies any environmental triggers. She does have 2 dogs and a cat at home. She denies obvious heartburn, recurrent pneumonias or childhood history of asthma.    12/31/2012  Pt has been doing advair BID. c/o raspy voice, chest congestion, coughing up lots of creamy phlem in the mornings. denies any wheezing, chest tx Letter for work given so that she does not have to room with others during travel. Questions about pregnancy discussed    Review of Systems neg for any significant sore throat, dysphagia, itching, sneezing, nasal congestion or excess/ purulent secretions, fever, chills, sweats, unintended  wt loss, pleuritic or exertional cp, hempoptysis, orthopnea pnd or change in chronic leg swelling. Also denies presyncope, palpitations, heartburn, abdominal pain, nausea, vomiting, diarrhea or change in bowel or urinary habits, dysuria,hematuria, rash, arthralgias, visual complaints, headache, numbness weakness or ataxia.     Objective:   Physical Exam  Gen. Pleasant, well-nourished, in no distress ENT - no lesions, no post nasal drip Neck: No JVD, no thyromegaly, no carotid bruits Lungs: no use of accessory muscles, no dullness to percussion, clear without rales or rhonchi  Cardiovascular: Rhythm regular, heart sounds  normal, no murmurs or gallops, no peripheral edema Musculoskeletal: No deformities, no cyanosis or clubbing          Assessment & Plan:

## 2012-12-31 NOTE — Assessment & Plan Note (Signed)
Sample of symbicort 160 twice daily to try instead of advair to see if voice improves Refill on singulair We discussed asthma & pregnancy

## 2013-01-01 ENCOUNTER — Other Ambulatory Visit: Payer: Self-pay

## 2013-01-01 MED ORDER — DESOGESTREL-ETHINYL ESTRADIOL 0.15-0.02/0.01 MG (21/5) PO TABS: 1.0000 | ORAL_TABLET | Freq: Every day | ORAL | Status: DC

## 2013-01-14 ENCOUNTER — Telehealth: Payer: Self-pay | Admitting: Pulmonary Disease

## 2013-01-14 MED ORDER — BUDESONIDE-FORMOTEROL FUMARATE 160-4.5 MCG/ACT IN AERO: 2.0000 | INHALATION_SPRAY | Freq: Two times a day (BID) | RESPIRATORY_TRACT | Status: DC

## 2013-01-14 NOTE — Telephone Encounter (Signed)
Rx has been sent in, pt aware. 

## 2013-01-25 ENCOUNTER — Encounter: Payer: Self-pay | Admitting: Family Medicine

## 2013-01-25 ENCOUNTER — Ambulatory Visit (INDEPENDENT_AMBULATORY_CARE_PROVIDER_SITE_OTHER): Payer: Managed Care, Other (non HMO) | Admitting: Family Medicine

## 2013-01-25 VITALS — BP 130/78 | HR 83 | Temp 98.1°F | Wt 182.2 lb

## 2013-01-25 DIAGNOSIS — J329 Chronic sinusitis, unspecified: Secondary | ICD-10-CM

## 2013-01-25 MED ORDER — FLUTICASONE PROPIONATE 50 MCG/ACT NA SUSP: 1.0000 | Freq: Two times a day (BID) | NASAL | Status: DC | PRN

## 2013-01-25 MED ORDER — LEVOFLOXACIN 500 MG PO TABS: ORAL_TABLET | ORAL | Status: DC

## 2013-01-25 NOTE — Patient Instructions (Addendum)

## 2013-01-25 NOTE — Progress Notes (Signed)
  Subjective:     Carolyn Hebert is a 37 y.o. female who presents for evaluation of sinus pain. Symptoms include: congestion, nasal congestion and sinus pressure. Onset of symptoms was a few weeks ago. Symptoms have been gradually worsening since that time. Past history is significant for asthma. Patient is a non-smoker.  The following portions of the patient's history were reviewed and updated as appropriate: allergies, current medications, past family history, past medical history, past social history, past surgical history and problem list.  Review of Systems Pertinent items are noted in HPI.   Objective:    BP 130/78  Pulse 83  Temp(Src) 98.1 F (36.7 C) (Tympanic)  Wt 182 lb 3.2 oz (82.645 kg)  BMI 28.53 kg/m2  SpO2 97% General appearance: alert, cooperative, appears stated age and no distress Ears: normal TM's and external ear canals both ears Nose: green discharge, moderate congestion, turbinates red, swollen, sinus tenderness bilateral Throat: abnormal findings: mild oropharyngeal erythema and PND Neck: mild anterior cervical adenopathy, supple, symmetrical, trachea midline and thyroid not enlarged, symmetric, no tenderness/mass/nodules Lungs: clear to auscultation bilaterally    Assessment:    Acute bacterial sinusitis.    Plan:    Neti pot recommended. Instructions given. Nasal steroids per medication orders. Antihistamines per medication orders. levaquin  per medication orders. ----per pulm note,  See pulm ov note

## 2013-02-06 ENCOUNTER — Telehealth: Payer: Self-pay | Admitting: Pulmonary Disease

## 2013-02-06 MED ORDER — BUDESONIDE-FORMOTEROL FUMARATE 160-4.5 MCG/ACT IN AERO: 2.0000 | INHALATION_SPRAY | Freq: Two times a day (BID) | RESPIRATORY_TRACT | Status: DC

## 2013-02-06 NOTE — Telephone Encounter (Signed)
Rx has been sent in. Pt is aware. 

## 2013-02-27 ENCOUNTER — Ambulatory Visit (INDEPENDENT_AMBULATORY_CARE_PROVIDER_SITE_OTHER): Payer: Managed Care, Other (non HMO) | Admitting: Internal Medicine

## 2013-02-27 VITALS — BP 126/82 | HR 79 | Temp 98.2°F | Wt 183.0 lb

## 2013-02-27 DIAGNOSIS — J45901 Unspecified asthma with (acute) exacerbation: Secondary | ICD-10-CM

## 2013-02-27 MED ORDER — PREDNISONE 5 MG PO TABS: ORAL_TABLET | ORAL | Status: DC

## 2013-02-27 MED ORDER — DOXYCYCLINE HYCLATE 100 MG PO TABS: 100.0000 mg | ORAL_TABLET | Freq: Two times a day (BID) | ORAL | Status: DC

## 2013-02-27 NOTE — Patient Instructions (Addendum)
Rest, fluids , tylenol For cough, take Mucinex DM twice a day as needed  Continue your routine meds and start prednisone Take the antibiotic as prescribed  (doxycycline) only if no better in 3-4 days Call if no improved in 1 week Call anytime if the symptoms are severe

## 2013-02-27 NOTE — Progress Notes (Signed)
  Subjective:    Patient ID: Carolyn Hebert, female    DOB: June 04, 1976, 37 y.o.   MRN: [id number]  HPI Acute visit Symptoms started yesterday with increase cough and greenish sputum, some fatigue. She is taking her medications as recommended, did not need albuterol as she has developed no wheezing. She admits to some achiness in the chest and tightness. Mild shortness of breath.  Past Medical History  Diagnosis Date  . Ovarian cyst   . LGSIL (low grade squamous intraepithelial lesion) on Pap smear   . Anxiety   . Depression   . IBS (irritable bowel syndrome)   . Dysplastic nevus   . HSV-2 infection   . Abnormal Pap smear    Past Surgical History  Procedure Laterality Date  . Cystectomy    . Cesarean section    . Laparoscopy    . Wisdom tooth extraction    . Septoplasty with ethmoidectomy, and maxillary antrostomy  2011     [place] , [place]      Review of Systems Chills last night, subjective fever. No nausea, vomiting. She has chronic sinusitis, nasal discharge slightly at both baseline.     Objective:   Physical Exam General -- alert, well-developed, no apparent distress.   HEENT -- TMs normal, throat w/o redness, face symmetric and not tender to palpation, nose slightly congested  Lungs -- normal respiratory effort, no intercostal retractions, no accessory muscle use, and normal breath sounds.   Heart-- normal rate, regular rhythm, no murmur, and no gallop.   Extremities-- no pretibial edema bilaterally Neurologic-- alert & oriented X3 and strength normal in all extremities. Psych-- Cognition and judgment appear intact. Alert and cooperative with normal attention span and concentration.  not anxious appearing and not depressed appearing.       Assessment & Plan:  Mild asthma exacerbation, Symptoms consistent with mild asthma exacerbation, recommend prednisone treatment firts, then antibiotics if not better. Patient request a very low dose of prednisone, see  prescription. Also requests an antibiotic different than Z-Pak, I prescribed doxycycline in case she needs it. See instructions.

## 2013-02-28 ENCOUNTER — Encounter: Payer: Self-pay | Admitting: Internal Medicine

## 2013-04-12 ENCOUNTER — Encounter: Payer: Managed Care, Other (non HMO) | Admitting: Family Medicine

## 2013-04-23 ENCOUNTER — Telehealth: Payer: Self-pay | Admitting: *Deleted

## 2013-04-23 ENCOUNTER — Encounter: Payer: Self-pay | Admitting: Pulmonary Disease

## 2013-04-23 ENCOUNTER — Ambulatory Visit (INDEPENDENT_AMBULATORY_CARE_PROVIDER_SITE_OTHER): Payer: Managed Care, Other (non HMO) | Admitting: Pulmonary Disease

## 2013-04-23 VITALS — BP 150/100 | HR 61 | Temp 98.1°F | Ht 67.0 in | Wt 188.6 lb

## 2013-04-23 DIAGNOSIS — J45901 Unspecified asthma with (acute) exacerbation: Secondary | ICD-10-CM

## 2013-04-23 DIAGNOSIS — Z8709 Personal history of other diseases of the respiratory system: Secondary | ICD-10-CM

## 2013-04-23 NOTE — Telephone Encounter (Signed)
Message copied by Erenest Rasher on Tue Apr 23, 2013  3:07 PM ------      Message from: Oretha Milch      Created: Tue Apr 23, 2013  2:52 PM       Can you pl tell her to rechk BP several times over next few days & contact PCP if remains high ? ------

## 2013-04-23 NOTE — Patient Instructions (Addendum)
Lung function at 78% - improved Stay on symbicort 2 puffs twice daily Call us if flare or increased albuterol use 

## 2013-04-23 NOTE — Assessment & Plan Note (Signed)
Lung function at 78% - improved Stay on symbicort 2 puffs twice daily Call us if flare or increased albuterol use

## 2013-04-23 NOTE — Assessment & Plan Note (Signed)
If recurrent sinusitis, need antibiotic x at least 14 days - (levaquin ) BP high today - needs recheck & FU with PCP is stays so

## 2013-04-23 NOTE — Telephone Encounter (Signed)
ATC patient, no answer LMOMTCB 

## 2013-04-23 NOTE — Progress Notes (Signed)
  Subjective:    Patient ID: Carolyn Hebert, female    DOB: 04-Aug-1976, 37 y.o.   MRN: [id number]  HPI  37 year old remote smoker who presents for evaluation of recurrent sinobronchitis and asthma. She just moved from [place] to [place] and I have reviewed records from her ENT physicians and pulmonologist in [place].  She had 4-5 primary care visits since September requiring multiple courses of antibiotics, nebulizer treatment in the office with some relief of symptoms. and a long course of prednisone. This episode started off as a URI illness that she probably contracted from her 20-year-old daughter. She carries a history of allergic rhinitis, chronic sinusitis and bronchitis. She underwent bilateral endoscopic sinus surgery in August 2011 but continued to have recurrent sinusitis. Cultures at one point did show Streptococcus pneumonia and pseudomonas. The possibility of long-term suppressive antibiotics such as twice weekly azithromycin was considered. She was on allergy shots. IgE levels were 40 6/12 and other immunoglobulin levels were normal.  He generally requires a smaller dose but longer course of prednisone.  Spirometry today showed FEV1 of 59% and FVC of 83% suggestive of moderate airway obstruction.  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 She does have 2 dogs and a cat at home.    04/23/2013 69m FU On last visit feb, changed from advair  to symbicort - voice/ hoarseness improved pt states breathing is good .sob upon some activity(stairs) pt has Sore thorat . denies any fever.  Given prednisone in 02/2013 for sinusitis with chest tightness On prevacid for GERD Did not take symbicort this am Spirometry shows Fev1 78% - improved No nocturnal symptoms  Review of Systems neg for any significant sore throat, dysphagia, itching, sneezing, nasal congestion or excess/ purulent secretions, fever, chills,  sweats, unintended wt loss, pleuritic or exertional cp, hempoptysis, orthopnea pnd or change in chronic leg swelling. Also denies presyncope, palpitations, heartburn, abdominal pain, nausea, vomiting, diarrhea or change in bowel or urinary habits, dysuria,hematuria, rash, arthralgias, visual complaints, headache, numbness weakness or ataxia.     Objective:   Physical Exam  Gen. Pleasant, well-nourished, in no distress ENT - no lesions, no post nasal drip Neck: No JVD, no thyromegaly, no carotid bruits Lungs: no use of accessory muscles, no dullness to percussion, clear without rales or rhonchi  Cardiovascular: Rhythm regular, heart sounds  normal, no murmurs or gallops, no peripheral edema Musculoskeletal: No deformities, no cyanosis or clubbing        Assessment & Plan:

## 2013-04-24 NOTE — Telephone Encounter (Signed)
LMTCBx1.Carolyn Hebert, CMA  

## 2013-04-24 NOTE — Telephone Encounter (Signed)
Returning call.Carolyn Hebert ° °

## 2013-04-24 NOTE — Telephone Encounter (Signed)
Spoke with pt and notified of recs per RA She verbalized understanding and states nothing further needed

## 2013-04-24 NOTE — Telephone Encounter (Signed)
Pt returned call.  Carolyn Hebert ° °

## 2013-04-24 NOTE — Telephone Encounter (Signed)
Pt returned call. Carolyn Hebert °

## 2013-04-24 NOTE — Telephone Encounter (Signed)
LMOMTCB x 1 

## 2013-04-26 ENCOUNTER — Telehealth: Payer: Self-pay | Admitting: Pulmonary Disease

## 2013-04-26 NOTE — Telephone Encounter (Signed)
lmomtcb x1 

## 2013-04-26 NOTE — Telephone Encounter (Signed)
Pt returned call. Carolyn Hebert °

## 2013-04-26 NOTE — Telephone Encounter (Signed)
Pt ret triage's call.  Holly D Pryor  

## 2013-04-26 NOTE — Telephone Encounter (Signed)
Per last ov note pt needs to contact her PCP if BP readings remained high I called the pt and have advised her that since her readings are still high, she needs to go ahead and call for appt with her PCP  Pt verbalized understanding and will call for appt today

## 2013-04-30 ENCOUNTER — Other Ambulatory Visit: Payer: Self-pay | Admitting: *Deleted

## 2013-04-30 ENCOUNTER — Encounter: Payer: Self-pay | Admitting: Family Medicine

## 2013-04-30 ENCOUNTER — Ambulatory Visit (INDEPENDENT_AMBULATORY_CARE_PROVIDER_SITE_OTHER): Payer: Managed Care, Other (non HMO) | Admitting: Family Medicine

## 2013-04-30 ENCOUNTER — Other Ambulatory Visit: Payer: Self-pay | Admitting: Family Medicine

## 2013-04-30 VITALS — BP 154/106 | HR 74 | Temp 99.4°F | Wt 186.8 lb

## 2013-04-30 DIAGNOSIS — I10 Essential (primary) hypertension: Secondary | ICD-10-CM

## 2013-04-30 DIAGNOSIS — R42 Dizziness and giddiness: Secondary | ICD-10-CM

## 2013-04-30 LAB — CBC WITH DIFFERENTIAL/PLATELET
Basophils Absolute: 0 10*3/uL (ref 0.0–0.1)
Eosinophils Absolute: 0.1 10*3/uL (ref 0.0–0.7)
Lymphocytes Relative: 21.8 % (ref 12.0–46.0)
MCHC: 33.6 g/dL (ref 30.0–36.0)
MCV: 92.4 fl (ref 78.0–100.0)
Monocytes Absolute: 0.4 10*3/uL (ref 0.1–1.0)
Neutrophils Relative %: 72.6 % (ref 43.0–77.0)
Platelets: 254 10*3/uL (ref 150.0–400.0)
RBC: 4.47 Mil/uL (ref 3.87–5.11)
RDW: 12.5 % (ref 11.5–14.6)

## 2013-04-30 LAB — HEPATIC FUNCTION PANEL
Alkaline Phosphatase: 26 U/L — ABNORMAL LOW (ref 39–117)
Bilirubin, Direct: 0.1 mg/dL (ref 0.0–0.3)
Total Bilirubin: 0.7 mg/dL (ref 0.3–1.2)
Total Protein: 6.9 g/dL (ref 6.0–8.3)

## 2013-04-30 LAB — POCT URINALYSIS DIPSTICK
Bilirubin, UA: NEGATIVE
Blood, UA: NEGATIVE
Leukocytes, UA: NEGATIVE
Nitrite, UA: NEGATIVE
Protein, UA: NEGATIVE
pH, UA: 7

## 2013-04-30 LAB — LIPID PANEL
HDL: 75.4 mg/dL (ref >39.00)
Total CHOL/HDL Ratio: 2
Triglycerides: 93 mg/dL (ref 0.0–149.0)
VLDL: 18.6 mg/dL (ref 0.0–40.0)

## 2013-04-30 LAB — BASIC METABOLIC PANEL
BUN: 13 mg/dL (ref 6–23)
Chloride: 104 mEq/L (ref 96–112)
Potassium: 3.9 mEq/L (ref 3.5–5.1)
Sodium: 138 mEq/L (ref 135–145)

## 2013-04-30 MED ORDER — LISINOPRIL-HYDROCHLOROTHIAZIDE 10-12.5 MG PO TABS: 1.0000 | ORAL_TABLET | Freq: Every day | ORAL | Status: DC

## 2013-04-30 NOTE — Patient Instructions (Signed)

## 2013-04-30 NOTE — Telephone Encounter (Signed)
Pt left VM that she only has 2 tabs left and mail order Rx will not arrive in time. Pt would like to get a 30 day supply sent in to local pharmacy. Pt notes that she was seen in office today. Called Pt back and left detail VM that Rx has already been sent to local pharmacy. Pt to return call to office with any additional concerns.

## 2013-05-02 ENCOUNTER — Encounter: Payer: Self-pay | Admitting: Family Medicine

## 2013-05-02 NOTE — Progress Notes (Signed)
  Subjective:    Patient here for follow-up of elevated blood pressure.  She is not exercising and is adherent to a low-salt diet.  Blood pressure is not well controlled at home. Cardiac symptoms: none. Patient denies: chest pain, chest pressure/discomfort, claudication, dyspnea, exertional chest pressure/discomfort, fatigue, irregular heart beat, lower extremity edema, near-syncope, orthopnea, palpitations, paroxysmal nocturnal dyspnea, syncope and tachypnea. Cardiovascular risk factors: hypertension and sedentary lifestyle. Use of agents associated with hypertension: none. History of target organ damage: none.  The following portions of the patient's history were reviewed and updated as appropriate: allergies, current medications, past family history, past medical history, past social history, past surgical history and problem list.  Review of Systems Pertinent items are noted in HPI.     Objective:    BP 154/106  Pulse 74  Temp(Src) 99.4 F (37.4 C) (Oral)  Wt 186 lb 12.8 oz (84.732 kg)  BMI 29.25 kg/m2  SpO2 98% General appearance: alert, cooperative, appears stated age and no distress Throat: lips, mucosa, and tongue normal; teeth and gums normal Neck: no adenopathy, no carotid bruit, no JVD, supple, symmetrical, trachea midline and thyroid not enlarged, symmetric, no tenderness/mass/nodules Lungs: clear to auscultation bilaterally Heart: S1, S2 normal Extremities: extremities normal, atraumatic, no cyanosis or edema   EKG-- NSR Assessment:    Hypertension, stage 1 . Evidence of target organ damage: none.    Plan:    Medication: begin lisinopril. Dietary sodium restriction. Regular aerobic exercise. Check blood pressures 2-3 times weekly and record. Follow up: 2 weeks and as needed.

## 2013-05-03 LAB — MICROALBUMIN / CREATININE URINE RATIO
Creatinine,U: 32.2 mg/dL
Microalb Creat Ratio: 0.6 mg/g (ref 0.0–30.0)

## 2013-05-08 ENCOUNTER — Ambulatory Visit (INDEPENDENT_AMBULATORY_CARE_PROVIDER_SITE_OTHER): Payer: Managed Care, Other (non HMO) | Admitting: Family Medicine

## 2013-05-08 ENCOUNTER — Encounter: Payer: Self-pay | Admitting: Family Medicine

## 2013-05-08 VITALS — BP 130/82 | HR 88 | Temp 99.0°F | Wt 185.2 lb

## 2013-05-08 DIAGNOSIS — I10 Essential (primary) hypertension: Secondary | ICD-10-CM | POA: Insufficient documentation

## 2013-05-08 DIAGNOSIS — J029 Acute pharyngitis, unspecified: Secondary | ICD-10-CM

## 2013-05-08 DIAGNOSIS — J020 Streptococcal pharyngitis: Secondary | ICD-10-CM

## 2013-05-08 MED ORDER — CEFTRIAXONE SODIUM 1 G IJ SOLR
1.0000 g | Freq: Once | INTRAMUSCULAR | Status: AC
  Administered 2013-05-08: 1 g via INTRAMUSCULAR

## 2013-05-08 MED ORDER — LISINOPRIL-HYDROCHLOROTHIAZIDE 20-25 MG PO TABS: 1.0000 | ORAL_TABLET | Freq: Every day | ORAL | Status: DC

## 2013-05-08 NOTE — Progress Notes (Signed)
  Subjective:     Carolyn Hebert is a 37 y.o. female who presents for evaluation of sore throat. Associated symptoms include enlarged tonsils, nasal blockage, pain while swallowing, post nasal drip, sinus and nasal congestion, sore throat, swollen glands and white spots in throat. Onset of symptoms was 2 days ago, and have been gradually worsening since that time. She is drinking plenty of fluids. She has not had a recent close exposure to someone with proven streptococcal pharyngitis.  The following portions of the patient's history were reviewed and updated as appropriate: allergies, current medications, past family history, past medical history, past social history, past surgical history and problem list.  Review of Systems Pertinent items are noted in HPI.    Objective:    BP 130/82  Pulse 88  Temp(Src) 99 F (37.2 C) (Oral)  Wt 185 lb 3.2 oz (84.006 kg)  BMI 29 kg/m2  SpO2 97% General appearance: alert, cooperative, appears stated age and no distress Ears: normal TM's and external ear canals both ears Nose: Nares normal. Septum midline. Mucosa normal. No drainage or sinus tenderness. Throat: abnormal findings: exudates present and mild oropharyngeal erythema Neck: no adenopathy, supple, symmetrical, trachea midline and thyroid not enlarged, symmetric, no tenderness/mass/nodules Lungs: clear to auscultation bilaterally Heart: S1, S2 normal  Laboratory Strep test done. Results:positive.    Assessment:    Acute pharyngitis, likely  Strep throat.    Plan:    Patient placed on antibiotics. Use of OTC analgesics recommended as well as salt water gargles. Patient advised that he will be infectious for 24 hours after starting antibiotics. Follow up as needed.

## 2013-05-08 NOTE — Patient Instructions (Addendum)
Strep Throat  Strep throat is an infection of the throat caused by a bacteria named Streptococcus pyogenes. Your caregiver may call the infection streptococcal "tonsillitis" or "pharyngitis" depending on whether there are signs of inflammation in the tonsils or back of the throat. Strep throat is most common in children aged 38 15 years during the cold months of the year, but it can occur in people of any age during any season. This infection is spread from person to person (contagious) through coughing, sneezing, or other close contact.  SYMPTOMS   · Fever or chills.  · Painful, swollen, red tonsils or throat.  · Pain or difficulty when swallowing.  · White or yellow spots on the tonsils or throat.  · Swollen, tender lymph nodes or "glands" of the neck or under the jaw.  · Red rash all over the body (rare).  DIAGNOSIS   Many different infections can cause the same symptoms. A test must be done to confirm the diagnosis so the right treatment can be given. A "rapid strep test" can help your caregiver make the diagnosis in a few minutes. If this test is not available, a light swab of the infected area can be used for a throat culture test. If a throat culture test is done, results are usually available in a day or two.  TREATMENT   Strep throat is treated with antibiotic medicine.  HOME CARE INSTRUCTIONS   · Gargle with 1 tsp of salt in 1 cup of warm water, 3 4 times per day or as needed for comfort.  · Family members who also have a sore throat or fever should be tested for strep throat and treated with antibiotics if they have the strep infection.  · Make sure everyone in your household washes their hands well.  · Do not share food, drinking cups, or personal items that could cause the infection to spread to others.  · You may need to eat a soft food diet until your sore throat gets better.  · Drink enough water and fluids to keep your urine clear or pale yellow. This will help prevent dehydration.  · Get plenty of  rest.  · Stay home from school, daycare, or work until you have been on antibiotics for 24 hours.  · Only take over-the-counter or prescription medicines for pain, discomfort, or fever as directed by your caregiver.  · If antibiotics are prescribed, take them as directed. Finish them even if you start to feel better.  SEEK MEDICAL CARE IF:   · The glands in your neck continue to enlarge.  · You develop a rash, cough, or earache.  · You cough up green, yellow-brown, or bloody sputum.  · You have pain or discomfort not controlled by medicines.  · Your problems seem to be getting worse rather than better.  SEEK IMMEDIATE MEDICAL CARE IF:   · You develop any new symptoms such as vomiting, severe headache, stiff or painful neck, chest pain, shortness of breath, or trouble swallowing.  · You develop severe throat pain, drooling, or changes in your voice.  · You develop swelling of the neck, or the skin on the neck becomes red and tender.  · You have a fever.  · You develop signs of dehydration, such as fatigue, dry mouth, and decreased urination.  · You become increasingly sleepy, or you cannot wake up completely.  Document Released: 11/11/2000 Document Revised: 10/31/2012 Document Reviewed: 01/13/2011  [employer]® Patient Information ©2014 [employer].

## 2013-05-08 NOTE — Assessment & Plan Note (Signed)
Inc lisinopril 20/25 1 po qd  rto  2 weeks

## 2013-05-09 MED ORDER — CEFDINIR 300 MG PO CAPS: 300.0000 mg | ORAL_CAPSULE | Freq: Two times a day (BID) | ORAL | Status: DC

## 2013-05-09 NOTE — Telephone Encounter (Signed)
Pr Dr.Lowne verbal Auth to send Omnicef 300 bid x's 10 days. Rx sent     KP

## 2013-05-13 ENCOUNTER — Encounter: Payer: Self-pay | Admitting: Family Medicine

## 2013-05-14 ENCOUNTER — Ambulatory Visit: Payer: Managed Care, Other (non HMO) | Admitting: Family Medicine

## 2013-05-23 ENCOUNTER — Ambulatory Visit: Payer: Managed Care, Other (non HMO) | Admitting: Family Medicine

## 2013-06-04 ENCOUNTER — Ambulatory Visit (INDEPENDENT_AMBULATORY_CARE_PROVIDER_SITE_OTHER): Payer: Managed Care, Other (non HMO) | Admitting: Family Medicine

## 2013-06-04 ENCOUNTER — Encounter: Payer: Self-pay | Admitting: Family Medicine

## 2013-06-04 VITALS — BP 128/88 | HR 81 | Temp 98.9°F | Wt 183.0 lb

## 2013-06-04 DIAGNOSIS — J309 Allergic rhinitis, unspecified: Secondary | ICD-10-CM

## 2013-06-04 DIAGNOSIS — K219 Gastro-esophageal reflux disease without esophagitis: Secondary | ICD-10-CM

## 2013-06-04 DIAGNOSIS — J302 Other seasonal allergic rhinitis: Secondary | ICD-10-CM

## 2013-06-04 DIAGNOSIS — I10 Essential (primary) hypertension: Secondary | ICD-10-CM

## 2013-06-04 MED ORDER — LANSOPRAZOLE 15 MG PO CPDR: 15.0000 mg | DELAYED_RELEASE_CAPSULE | Freq: Every day | ORAL | Status: DC

## 2013-06-04 MED ORDER — FEXOFENADINE HCL 180 MG PO TABS: 180.0000 mg | ORAL_TABLET | Freq: Every day | ORAL | Status: DC

## 2013-06-04 MED ORDER — AZELASTINE-FLUTICASONE 137-50 MCG/ACT NA SUSP: 1.0000 | Freq: Two times a day (BID) | NASAL | Status: DC

## 2013-06-04 NOTE — Assessment & Plan Note (Addendum)
Slightly elevated today Pt is under a lot of stress at home  Watch salt rto 3 months or sooner prn

## 2013-06-04 NOTE — Patient Instructions (Signed)

## 2013-06-04 NOTE — Progress Notes (Signed)
  Subjective:     Carolyn Hebert is a 37 y.o. female who presents for evaluation of symptoms of a URI. Symptoms include congestion, facial pain, nasal congestion, post nasal drip and sinus pressure. Onset of symptoms was 2 days ago, and has been gradually worsening since that time. Treatment to date: antihistamines and nasal steroids. Pt is also here to f/u bp.  No cp, no sob, no palpitations. The following portions of the patient's history were reviewed and updated as appropriate: allergies, current medications, past family history, past medical history, past social history, past surgical history and problem list.  Review of Systems Pertinent items are noted in HPI.   Objective:    BP 124/88  Pulse 81  Temp(Src) 98.9 F (37.2 C) (Oral)  Wt 183 lb (83.008 kg)  BMI 28.66 kg/m2  SpO2 97% General appearance: alert, cooperative, appears stated age and no distress Ears: normal TM's and external ear canals both ears Nose: clear discharge, mild congestion, turbinates pink, sinus tenderness bilateral Throat: lips, mucosa, and tongue normal; teeth and gums normal Neck: mild anterior cervical adenopathy, no adenopathy, supple, symmetrical, trachea midline and thyroid not enlarged, symmetric, no tenderness/mass/nodules Lungs: clear to auscultation bilaterally Heart: S1, S2 normal Extremities: extremities normal, atraumatic, no cyanosis or edema   Assessment:    allergic rhinitis   Plan:    Discussed diagnosis and treatment of URI. Suggested symptomatic OTC remedies. Nasal saline spray for congestion. Nasal steroids per orders. Follow up as needed. ---f/u 3 months for bp

## 2013-06-05 ENCOUNTER — Encounter: Payer: Managed Care, Other (non HMO) | Admitting: Family Medicine

## 2013-07-04 ENCOUNTER — Other Ambulatory Visit: Payer: Self-pay | Admitting: Family Medicine

## 2013-07-10 ENCOUNTER — Telehealth: Payer: Self-pay | Admitting: Pulmonary Disease

## 2013-07-10 NOTE — Telephone Encounter (Signed)
This was faxed back to the # on form yesterday afternoon.   lmtcb x1 for pt

## 2013-07-11 NOTE — Telephone Encounter (Signed)
LMTCBx2. Keyosha Tiedt, CMA  

## 2013-07-11 NOTE — Telephone Encounter (Signed)
I spoke with pt and made her aware it was faxed back Tuesday afternoon. Nothing further needed

## 2013-07-11 NOTE — Telephone Encounter (Signed)
Pt returned call.Raylene Everts

## 2013-07-11 NOTE — Telephone Encounter (Signed)
Pt returned call. Carolyn Hebert  

## 2013-07-11 NOTE — Telephone Encounter (Signed)
LMTCB x1 for pt.  

## 2013-07-20 ENCOUNTER — Other Ambulatory Visit: Payer: Self-pay | Admitting: Family Medicine

## 2013-07-20 DIAGNOSIS — J302 Other seasonal allergic rhinitis: Secondary | ICD-10-CM

## 2013-07-20 DIAGNOSIS — K219 Gastro-esophageal reflux disease without esophagitis: Secondary | ICD-10-CM

## 2013-07-22 MED ORDER — LANSOPRAZOLE 15 MG PO CPDR: 15.0000 mg | DELAYED_RELEASE_CAPSULE | Freq: Every day | ORAL | Status: DC

## 2013-07-22 MED ORDER — FEXOFENADINE HCL 180 MG PO TABS: 180.0000 mg | ORAL_TABLET | Freq: Every day | ORAL | Status: DC

## 2013-07-23 ENCOUNTER — Ambulatory Visit (INDEPENDENT_AMBULATORY_CARE_PROVIDER_SITE_OTHER): Payer: Managed Care, Other (non HMO) | Admitting: Nurse Practitioner

## 2013-07-23 ENCOUNTER — Encounter: Payer: Self-pay | Admitting: Nurse Practitioner

## 2013-07-23 ENCOUNTER — Encounter: Payer: Self-pay | Admitting: *Deleted

## 2013-07-23 VITALS — BP 110/70 | HR 78 | Temp 97.9°F | Ht 67.0 in | Wt 184.0 lb

## 2013-07-23 DIAGNOSIS — J45901 Unspecified asthma with (acute) exacerbation: Secondary | ICD-10-CM

## 2013-07-23 DIAGNOSIS — J209 Acute bronchitis, unspecified: Secondary | ICD-10-CM

## 2013-07-23 MED ORDER — IPRATROPIUM BROMIDE 0.02 % IN SOLN
0.5000 mg | Freq: Once | RESPIRATORY_TRACT | Status: AC
  Administered 2013-07-23: 0.5 mg via RESPIRATORY_TRACT

## 2013-07-23 MED ORDER — METHYLPREDNISOLONE ACETATE 40 MG/ML IJ SUSP
40.0000 mg | Freq: Once | INTRAMUSCULAR | Status: AC
  Administered 2013-07-23: 40 mg via INTRAMUSCULAR

## 2013-07-23 MED ORDER — PREDNISONE 10 MG PO TABS: ORAL_TABLET | ORAL | Status: DC

## 2013-07-23 MED ORDER — ALBUTEROL SULFATE (5 MG/ML) 0.5% IN NEBU
5.0000 mg | INHALATION_SOLUTION | Freq: Once | RESPIRATORY_TRACT | Status: AC
  Administered 2013-07-23: 5 mg via RESPIRATORY_TRACT

## 2013-07-23 NOTE — Patient Instructions (Addendum)
Please start sinus washes daily (Neilmed sinus rinse bottle w/black top). Use nebulizer twice daily for a few days. Rest for a few days. Please call for re-evaluation if your fatigue does not improve, fever is persistent, or cough or chest discomfort is not improved within 2-3 days. You may use delsym cough syrup at night if cough is keeping you awake.  Feel better!  Asthma Attack Prevention HOW CAN ASTHMA BE PREVENTED? Currently, there is no way to prevent asthma from starting. However, you can take steps to control the disease and prevent its symptoms after you have been diagnosed. Learn about your asthma and how to control it. Take an active role to control your asthma by working with your caregiver to create and follow an asthma action plan. An asthma action plan guides you in taking your medicines properly, avoiding factors that make your asthma worse, tracking your level of asthma control, responding to worsening asthma, and seeking emergency care when needed. To track your asthma, keep records of your symptoms, check your peak flow number using a peak flow meter (handheld device that shows how well air moves out of your lungs), and get regular asthma checkups.  Other ways to prevent asthma attacks include:  Use medicines as your caregiver directs.  Identify and avoid things that make your asthma worse (as much as you can).  Keep track of your asthma symptoms and level of control.  Get regular checkups for your asthma.  With your caregiver, write a detailed plan for taking medicines and managing an asthma attack. Then be sure to follow your action plan. Asthma is an ongoing condition that needs regular monitoring and treatment.  Identify and avoid asthma triggers. A number of outdoor allergens and irritants (pollen, mold, cold air, air pollution) can trigger asthma attacks. Find out what causes or makes your asthma worse, and take steps to avoid those triggers.  Monitor your breathing.  Learn to recognize warning signs of an attack, such as slight coughing, wheezing or shortness of breath. However, your lung function may already decrease before you notice any signs or symptoms, so regularly measure and record your peak airflow with a home peak flow meter.  Identify and treat attacks early. If you act quickly, you're less likely to have a severe attack. You will also need less medicine to control your symptoms. When your peak flow measurements decrease and alert you to an upcoming attack, take your medicine as instructed, and immediately stop any activity that may have triggered the attack. If your symptoms do not improve, get medical help.  Pay attention to increasing quick-relief inhaler use. If you find yourself relying on your quick-relief inhaler (such as albuterol), your asthma is not under control. See your caregiver about adjusting your treatment. IDENTIFY AND CONTROL FACTORS THAT MAKE YOUR ASTHMA WORSE A number of common things can set off or make your asthma symptoms worse (asthma triggers). Keep track of your asthma symptoms for several weeks, detailing all the environmental and emotional factors that are linked with your asthma. When you have an asthma attack, go back to your asthma diary to see which factor, or combination of factors, might have contributed to it. Once you know what these factors are, you can take steps to control many of them.  Allergies: If you have allergies and asthma, it is important to take asthma prevention steps at home. Asthma attacks (worsening of asthma symptoms) can be triggered by allergies, which can cause temporary increased inflammation of your airways. Minimizing  contact with the substance to which you are allergic will help prevent an asthma attack. Animal Dander:   Some people are allergic to the flakes of skin or dried saliva from animals with fur or feathers. Keep these pets out of your home.  If you can't keep a pet outdoors, keep the  pet out of your bedroom and other sleeping areas at all times, and keep the door closed.  Remove carpets and furniture covered with cloth from your home. If that is not possible, keep the pet away from fabric-covered furniture and carpets. Dust Mites:  Many people with asthma are allergic to dust mites. Dust mites are tiny bugs that are found in every home, in mattresses, pillows, carpets, fabric-covered furniture, bedcovers, clothes, stuffed toys, fabric, and other fabric-covered items.  Cover your mattress in a special dust-proof cover.  Cover your pillow in a special dust-proof cover, or wash the pillow each week in hot water. Water must be hotter than 130 F to kill dust mites. Cold or warm water used with detergent and bleach can also be effective.  Wash the sheets and blankets on your bed each week in hot water.  Try not to sleep or lie on cloth-covered cushions.  Call ahead when traveling and ask for a smoke-free hotel room. Bring your own bedding and pillows, in case the hotel only supplies feather pillows and down comforters, which may contain dust mites and cause asthma symptoms.  Remove carpets from your bedroom and those laid on concrete, if you can.  Keep stuffed toys out of the bed, or wash the toys weekly in hot water or cooler water with detergent and bleach. Cockroaches:  Many people with asthma are allergic to the droppings and remains of cockroaches.  Keep food and garbage in closed containers. Never leave food out.  Use poison baits, traps, powders, gels, or paste (for example, boric acid).  If a spray is used to kill cockroaches, stay out of the room until the odor goes away. Indoor Mold:  Fix leaky faucets, pipes, or other sources of water that have mold around them.  Clean floors and moldy surfaces with a fungicide or diluted bleach.  Avoid using humidifiers, vaporizers, or swamp coolers. These can spread molds through the air. Pollen and Outdoor  Mold:  When pollen or mold spore counts are high, try to keep your windows closed.  Stay indoors with windows closed from late morning to afternoon, if you can. Pollen and some mold spore counts are highest at that time.  Ask your caregiver whether you need to take or increase anti-inflammatory medicine before your allergy season starts. Irritants:   Tobacco smoke is an irritant. If you smoke, ask your caregiver how you can quit. Ask family members to quit smoking, too. Do not allow smoking in your home or car.  If possible, do not use a wood-burning stove, kerosene heater, or fireplace. Minimize exposure to all sources of smoke, including incense, candles, fires, and fireworks.  Try to stay away from strong odors and sprays, such as perfume, talcum powder, hair spray, and paints.  Decrease humidity in your home and use an indoor air cleaning device. Reduce indoor humidity to below 60 percent. Dehumidifiers or central air conditioners can do this.  Decrease house dust exposure by changing furnace and air cooler filters frequently.  Try to have someone else vacuum for you once or twice a week, if you can. Stay out of rooms while they are being vacuumed and for a  short while afterward.  If you vacuum, use a dust mask from a hardware store, a double-layered or microfilter vacuum cleaner bag, or a vacuum cleaner with a HEPA filter.  Sulfites in foods and beverages can be irritants. Do not drink beer or wine, or eat dried fruit, processed potatoes, or shrimp if they cause asthma symptoms.  Cold air can trigger an asthma attack. Cover your nose and mouth with a scarf on cold or windy days.  Several health conditions can make asthma more difficult to manage, including runny nose, sinus infections, reflux disease, psychological stress, and sleep apnea. Your caregiver will treat these conditions, as well.  Avoid close contact with people who have a cold or the flu, since your asthma symptoms may  get worse if you catch the infection from them. Wash your hands thoroughly after touching items that may have been handled by people with a respiratory infection.  Get a flu shot every year to protect against the flu virus, which often makes asthma worse for days or weeks. Also get a pneumonia shot once every 5 10 years. Medicines:  Aspirin and other pain relievers can cause asthma attacks. Ten percent to 20% of people with asthma have sensitivity to aspirin or a group of pain relievers called non-steroidal anti-inflammatory medicines (NSAIDS), such as ibuprofen and naproxen. These medicines are used to treat pain and reduce fevers. Asthma attacks caused by any of these medicines can be severe and even fatal. These medicines must be avoided in people who have known aspirin sensitive asthma. Products with acetaminophen are considered safe for people who have asthma. It is important that people with aspirin sensitivity read labels of all over-the-counter medicines used to treat pain, colds, coughs, and fever.  Beta blockers and ACE inhibitors are other medicines which you should discuss with your caregiver, in relation to your asthma. ALLERGY SKIN TESTING  Ask your asthma caregiver about allergy skin testing or blood testing (RAST test) to identify the allergens to which you are sensitive. If you are found to have allergies, allergy shots (immunotherapy) for asthma may help prevent future allergies and asthma. With allergy shots, small doses of allergens (substances to which you are allergic) are injected under your skin on a regular schedule. Over a period of time, your body may become used to the allergen and less responsive with asthma symptoms. You can also take measures to minimize your exposure to those allergens. EXERCISE  If you have exercise-induced asthma, or are planning vigorous exercise, or exercise in cold, humid, or dry environments, prevent exercise-induced asthma by following your  caregiver's advice regarding asthma treatment before exercising. Document Released: 11/02/2009 Document Revised: 10/31/2012 Document Reviewed: 11/02/2009 [facility] Patient Information 2014 [place], [place].

## 2013-07-24 ENCOUNTER — Encounter: Payer: Self-pay | Admitting: Pulmonary Disease

## 2013-07-24 ENCOUNTER — Encounter: Payer: Self-pay | Admitting: Family Medicine

## 2013-07-24 ENCOUNTER — Telehealth: Payer: Self-pay | Admitting: Pulmonary Disease

## 2013-07-24 ENCOUNTER — Telehealth: Payer: Self-pay | Admitting: *Deleted

## 2013-07-24 DIAGNOSIS — J45901 Unspecified asthma with (acute) exacerbation: Secondary | ICD-10-CM

## 2013-07-24 MED ORDER — PREDNISONE 10 MG PO TABS: ORAL_TABLET | ORAL | Status: DC

## 2013-07-24 MED ORDER — MONTELUKAST SODIUM 10 MG PO TABS: 10.0000 mg | ORAL_TABLET | Freq: Every day | ORAL | Status: DC

## 2013-07-24 NOTE — Telephone Encounter (Signed)
Resent rx to [facility].

## 2013-07-24 NOTE — Telephone Encounter (Signed)
I spoke with pt and is aware RX has been sent to [facility]. She also stated she wants to come in and see [place] bc she was told she has bronchitis and was put on prednisone. appt scheduled with MW and nothing further needed

## 2013-07-24 NOTE — Telephone Encounter (Signed)
See phone note 07/24/13

## 2013-07-24 NOTE — Progress Notes (Signed)
  Subjective:    Patient ID: Carolyn Hebert, female    DOB: 1976/11/11, 37 y.o.   MRN: [id number]  Asthma She complains of chest tightness (c/o burning in chest, not aggravated by coughing or deep inspiration), cough, hoarse voice, shortness of breath and sputum production. This is a recurrent problem. The current episode started in the past 7 days (4 days ago developed sore throat & HA, nasal congestion 3 da, productive cough 1 da.). The problem has been gradually worsening. The cough is productive, hoarse, productive of purulent sputum and dry. Associated symptoms include chest pain, dyspnea on exertion, a fever (has had chills), headaches, malaise/fatigue, nasal congestion, sneezing and a sore throat. Pertinent negatives include no appetite change, ear pain or trouble swallowing. Her symptoms are aggravated by any activity. Her symptoms are alleviated by nothing. Improvement on treatment: has not used nebulizer. Risk factors: former smoker. Her past medical history is significant for asthma and bronchitis.      Review of Systems  Constitutional: Positive for fever (has had chills) and malaise/fatigue. Negative for appetite change.  HENT: Positive for congestion, sore throat, hoarse voice and sneezing. Negative for hearing loss, ear pain, trouble swallowing, neck pain, neck stiffness and tinnitus.   Eyes: Negative for itching.  Respiratory: Positive for cough, sputum production, chest tightness and shortness of breath.   Cardiovascular: Positive for chest pain and dyspnea on exertion. Negative for palpitations.  Gastrointestinal: Negative for nausea, abdominal pain and diarrhea.  Skin: Negative for color change and rash.  Allergic/Immunologic: Positive for environmental allergies.  Neurological: Positive for headaches.  Hematological: Positive for adenopathy.  Psychiatric/Behavioral: Positive for sleep disturbance (due to coughing).       Objective:   Physical Exam  Vitals  reviewed. Constitutional: She is oriented to person, place, and time. She appears well-developed and well-nourished. No distress.  Looks tired, frequent coughing during exam  HENT:  Head: Normocephalic and atraumatic.  Right Ear: External ear normal.  Left Ear: External ear normal.  Mouth/Throat: Oropharynx is clear and moist. No oropharyngeal exudate.  Eyes: Conjunctivae are normal. Right eye exhibits no discharge. Left eye exhibits no discharge.  Neck: Normal range of motion. Neck supple. No tracheal deviation present. No thyromegaly present.  Cardiovascular: Normal rate, regular rhythm and normal heart sounds.   Pulmonary/Chest: Effort normal. No respiratory distress. She has no wheezes.  Diminished breath sounds on posterior mid right   Lymphadenopathy:    She has no cervical adenopathy.  Neurological: She is alert and oriented to person, place, and time.  Skin: Skin is warm and dry.  Psychiatric: She has a normal mood and affect. Her behavior is normal. Thought content normal.          Assessment & Plan:  1. Asthma exacerbation DD: pneumonia - albuterol (PROVENTIL) (5 MG/ML) 0.5% nebulizer solution 5 mg; Take 1 mL (5 mg total) by nebulization once. - ipratropium (ATROVENT) nebulizer solution 0.5 mg; Take 2.5 mLs (0.5 mg total) by nebulization once. - methylPREDNISolone acetate (DEPO-MEDROL) injection 40 mg; Inject 1 mL (40 mg total) into the muscle once. See pt instruction.

## 2013-07-25 ENCOUNTER — Ambulatory Visit (INDEPENDENT_AMBULATORY_CARE_PROVIDER_SITE_OTHER): Payer: Managed Care, Other (non HMO) | Admitting: Internal Medicine

## 2013-07-25 ENCOUNTER — Encounter: Payer: Self-pay | Admitting: Internal Medicine

## 2013-07-25 VITALS — BP 136/84 | HR 87 | Temp 98.4°F | Ht 67.0 in | Wt 185.0 lb

## 2013-07-25 DIAGNOSIS — J45901 Unspecified asthma with (acute) exacerbation: Secondary | ICD-10-CM

## 2013-07-25 DIAGNOSIS — I10 Essential (primary) hypertension: Secondary | ICD-10-CM

## 2013-07-25 MED ORDER — LEVOFLOXACIN 750 MG PO TABS: 750.0000 mg | ORAL_TABLET | Freq: Every day | ORAL | Status: DC

## 2013-07-25 MED ORDER — FAMOTIDINE 20 MG PO TABS: ORAL_TABLET | ORAL | Status: DC

## 2013-07-25 MED ORDER — PANTOPRAZOLE SODIUM 40 MG PO TBEC: 40.0000 mg | DELAYED_RELEASE_TABLET | Freq: Every day | ORAL | Status: DC

## 2013-07-25 MED ORDER — OLMESARTAN MEDOXOMIL-HCTZ 20-12.5 MG PO TABS: 1.0000 | ORAL_TABLET | Freq: Every day | ORAL | Status: DC

## 2013-07-25 NOTE — Assessment & Plan Note (Signed)
Symptoms are markedly disproportionate to objective findings and not clear this is a lung problem but pt does appear to have difficult airway management issues. DDX of  difficult airways managment all start with A and  include Adherence, Ace Inhibitors, Acid Reflux, Active Sinus Disease, Alpha 1 Antitripsin deficiency, Anxiety masquerading as Airways dz,  ABPA,  allergy(esp in young), Aspiration (esp in elderly), Adverse effects of DPI,  Active smokers, plus two Bs  = Bronchiectasis and Beta blocker use..and one C= CHF  ACEi the "leading suspect" here and needs trial off - given her frequent severe and non-specific resp complaints I would avoid this class indefinitely  ? Acid reflux > Explained natural history of uri and why it's necessary in patients at risk to treat GERD aggressively  at least  short term   to reduce risk of evolving cyclical cough initially  triggered by epithelial injury and a heightened sensitivty to the effects of any upper airway irritants,  most importantly acid - related.  That is, the more sensitive the epithelium damaged for virus, the more the cough, the more the secondary reflux (especially in those prone to reflux) the more the irritation of the sensitive mucosa and so on in a cyclical pattern.   For now rx protonix q am and pepcid 20 mg qhs   ? Sinus dz > rx levaquin 750 x 5 days only with f/u sinus ct later if not better

## 2013-07-25 NOTE — Progress Notes (Signed)
Subjective:    Patient ID: Carolyn Hebert, female    DOB: 1976/05/19, 37 y.o.   MRN: [id number]  HPI  37 year old remote smoker who presents for evaluation of recurrent sinobronchitis and asthma. She just moved from [place] to [place] and I have reviewed records from her ENT physicians and pulmonologist in [place].  She had 4-5 primary care visits since September requiring multiple courses of antibiotics, nebulizer treatment in the office with some relief of symptoms. and a long course of prednisone. This episode started off as a URI illness that she probably contracted from her 43-year-old daughter. She carries a history of allergic rhinitis, chronic sinusitis and bronchitis. She underwent bilateral endoscopic sinus surgery in August 2011 but continued to have recurrent sinusitis. Cultures at one point did show Streptococcus pneumonia and pseudomonas. The possibility of long-term suppressive antibiotics such as twice weekly azithromycin was considered. She was on allergy shots. IgE levels were 40 6/12 and other immunoglobulin levels were normal.  He generally requires a smaller dose but longer course of prednisone.  Spirometry today showed FEV1 of 59% and FVC of 83% suggestive of moderate airway obstruction.  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 She does have 2 dogs and a cat at home.    04/23/2013 51m FU On last visit feb, changed from advair  to symbicort - voice/ hoarseness improved pt states breathing is good .sob upon some activity(stairs) pt has Sore thorat . denies any fever.  Given prednisone in 02/2013 for sinusitis with chest tightness On prevacid for GERD Did not take symbicort this am Spirometry shows Fev1 78% - improved No nocturnal symptoms rec Lung function at 78% - improved Stay on symbicort 2 puffs twice daily  07/25/2013  Acute w/in ov/Carolyn Hebert "never completely better since last  ov" Chief Complaint  Patient presents with  . Acute Visit    Pt reports dxed with bronchitis 07/23/13- She states that this is her second time having bronchitis within one month. She c/o prod cough with moderate green to yellow sputum and increased SOB for the past wk.   aburptly ill  8/23 by 8/26 seen and started on prednisone and not really better yet  Mucus green Baseline symbicort 2 every 12 plus prev 15 and no need for saba, but now saba an neb and still not better assoc with sore throat and hoarsness   No obvious daytime variabilty or assoc  cp or chest tightness,  overt sinus or hb symptoms. No unusual exp hx or h/o childhood pna/ asthma or knowledge of premature birth.   Sleeping ok without nocturnal  or early am exacerbation  of respiratory  c/o's or need for noct saba. Also denies any obvious fluctuation of symptoms with weather or environmental changes or other aggravating or alleviating factors except as outlined above  Current Medications, Allergies, Past Medical History, Past Surgical History, Family History, and Social History were reviewed in [employer] [facility] record.  ROS  The following are not active complaints unless bolded sore throat, dysphagia, dental problems, itching, sneezing,  nasal congestion or excess/ purulent secretions, ear ache,   fever, chills, sweats, unintended wt loss, pleuritic or exertional cp, hemoptysis,  orthopnea pnd or leg swelling, presyncope, palpitations, heartburn, abdominal pain, anorexia, nausea, vomiting, diarrhea  or change in bowel or urinary habits, change in stools or urine, dysuria,hematuria,  rash, arthralgias, visual complaints, headache, numbness weakness or ataxia or problems with walking  or coordination,  change in mood/affect or memory.             Objective:   Physical Exam  Gen. Pleasant, well-nourished, in no distress but very hoarse with mild pseudowheeze HEENT: nl dentition, turbinates, and orophanx. Nl  external ear canals without cough reflex   NECK :  without JVD/Nodes/TM/ nl carotid upstrokes bilaterally   LUNGS: no acc muscle use, clear to A and P bilaterally without cough on insp or exp maneuvers   CV:  RRR  no s3 or murmur or increase in P2, no edema   ABD:  soft and nontender with nl excursion in the supine position. No bruits or organomegaly, bowel sounds nl  MS:  warm without deformities, calf tenderness, cyanosis or clubbing  SKIN: warm and dry without lesions    NEURO:  alert, approp, no deficits         Assessment & Plan:

## 2013-07-25 NOTE — Assessment & Plan Note (Signed)
ACE inhibitors are problematic in  pts with airway complaints because  even experienced pulmonologists can't always distinguish ace effects from copd/asthma.  By themselves they don't actually cause a problem, much like oxygen can't by itself start a fire, but they certainly serve as a powerful catalyst or enhancer for any "fire"  or inflammatory process in the upper airway, be it caused by  URI's  or more commonly reflux (especially in the obese or pts with known GERD or who are on biphoshonates), or advair, which was the case previously with her.     In the era of ARB near equivalency would not use acei in this type of pt, but if do so track very carefully all the additional meds needed to treat the non-specific symptoms and see if they rise with acei use, which has clearly been the case here

## 2013-07-25 NOTE — Patient Instructions (Addendum)
Stop prinizide Start benicar 20 /12.5 one daily x one month samples- this is the only way to know to what extent the prinizide (ace inhibitor) is contributing to your recurrent respiratory symptoms  Levaquin 750 mg one daily x 5 days   Pantoprazole (protonix) 40 mg   Take 30-60 min before first meal of the day and Pepcid 20 mg one bedtime until cough and hoarseness and throat irritation are all gone, then ok to take prevacid otc if you wish  You will need to see your primary doctor in 4 weeks for blood pressure management.  Call us in meantime if respiratory problems don't improve convincingly over the next few weeks

## 2013-07-31 ENCOUNTER — Telehealth: Payer: Self-pay | Admitting: Pulmonary Disease

## 2013-07-31 MED ORDER — LEVOFLOXACIN 750 MG PO TABS: 750.0000 mg | ORAL_TABLET | Freq: Every day | ORAL | Status: DC

## 2013-07-31 NOTE — Telephone Encounter (Signed)
Ok x 5 more days levaquin 750 daily then sinus ct if not 100% back to baseline

## 2013-07-31 NOTE — Telephone Encounter (Signed)
rx sent. [id number] to advise the pt. Carron Curie, CMA

## 2013-07-31 NOTE — Telephone Encounter (Signed)
Pt aware of recs. 

## 2013-07-31 NOTE — Telephone Encounter (Signed)
I spoke with pt. She saw MW on 07/25/13 for acute visit. She stated she finished the levaquin Monday. She is still on prednisone. She does not feel as bad but is still having sinus and chest congestion, cough w/ creamy color phlem, PND. She thinks she needs her abx extended. Please advise MW thanks  Allergies  Allergen Reactions  . Penicillins     hives

## 2013-07-31 NOTE — Telephone Encounter (Signed)
Returning call can be reached at [phone].Raylene Everts

## 2013-08-14 ENCOUNTER — Encounter: Payer: Self-pay | Admitting: Family Medicine

## 2013-08-14 ENCOUNTER — Ambulatory Visit (INDEPENDENT_AMBULATORY_CARE_PROVIDER_SITE_OTHER): Payer: Managed Care, Other (non HMO) | Admitting: Family Medicine

## 2013-08-14 VITALS — BP 128/86 | HR 86 | Temp 98.7°F | Wt 188.4 lb

## 2013-08-14 DIAGNOSIS — I10 Essential (primary) hypertension: Secondary | ICD-10-CM

## 2013-08-14 DIAGNOSIS — J302 Other seasonal allergic rhinitis: Secondary | ICD-10-CM

## 2013-08-14 DIAGNOSIS — J309 Allergic rhinitis, unspecified: Secondary | ICD-10-CM

## 2013-08-14 MED ORDER — OLMESARTAN MEDOXOMIL-HCTZ 40-12.5 MG PO TABS: 1.0000 | ORAL_TABLET | Freq: Every day | ORAL | Status: DC

## 2013-08-14 MED ORDER — FLUTICASONE PROPIONATE 50 MCG/ACT NA SUSP: NASAL | Status: DC

## 2013-08-14 NOTE — Patient Instructions (Signed)

## 2013-08-14 NOTE — Progress Notes (Signed)
  Subjective:    Patient here for follow-up of elevated blood pressure.  She is exercising and is adherent to a low-salt diet.  Blood pressure is not well controlled at home. Cardiac symptoms: none. Patient denies: chest pain, chest pressure/discomfort, claudication, dyspnea, exertional chest pressure/discomfort, fatigue, irregular heart beat, lower extremity edema, near-syncope, orthopnea, palpitations, paroxysmal nocturnal dyspnea, syncope and tachypnea. Cardiovascular risk factors: hypertension. Use of agents associated with hypertension: none. History of target organ damage: none.  The following portions of the patient's history were reviewed and updated as appropriate: allergies, current medications, past family history, past medical history, past social history, past surgical history and problem list.  Review of Systems Pertinent items are noted in HPI.     Objective:    BP 128/86  Pulse 86  Temp(Src) 98.7 F (37.1 C) (Oral)  Wt 188 lb 6.4 oz (85.458 kg)  BMI 29.5 kg/m2  SpO2 97% General appearance: alert, cooperative and no distress Lungs: clear to auscultation bilaterally Heart: S1, S2 normal Extremities: extremities normal, atraumatic, no cyanosis or edema    Assessment:    Hypertension, stage 1 . Evidence of target organ damage: none.    Plan:    Medication: increase to benicar 40/12.5. Dietary sodium restriction. Regular aerobic exercise. Check blood pressures 2-3 times weekly and record. Follow up: 3 months and as needed. ---but send bp reading via my chart in 2-3 weeks

## 2013-09-02 ENCOUNTER — Ambulatory Visit: Payer: Managed Care, Other (non HMO) | Admitting: Family Medicine

## 2013-09-05 ENCOUNTER — Ambulatory Visit (INDEPENDENT_AMBULATORY_CARE_PROVIDER_SITE_OTHER): Payer: Managed Care, Other (non HMO)

## 2013-09-05 ENCOUNTER — Other Ambulatory Visit: Payer: Self-pay | Admitting: Family Medicine

## 2013-09-05 ENCOUNTER — Encounter: Payer: Self-pay | Admitting: Family Medicine

## 2013-09-05 DIAGNOSIS — I10 Essential (primary) hypertension: Secondary | ICD-10-CM

## 2013-09-05 DIAGNOSIS — Z23 Encounter for immunization: Secondary | ICD-10-CM

## 2013-09-05 MED ORDER — OLMESARTAN MEDOXOMIL-HCTZ 40-12.5 MG PO TABS: 1.0000 | ORAL_TABLET | Freq: Every day | ORAL | Status: DC

## 2013-09-05 NOTE — Telephone Encounter (Signed)
Please advise      KP 

## 2013-09-09 ENCOUNTER — Other Ambulatory Visit: Payer: Self-pay | Admitting: Family Medicine

## 2013-09-09 DIAGNOSIS — I10 Essential (primary) hypertension: Secondary | ICD-10-CM

## 2013-09-09 MED ORDER — OLMESARTAN MEDOXOMIL-HCTZ 40-12.5 MG PO TABS: 1.0000 | ORAL_TABLET | Freq: Every day | ORAL | Status: DC

## 2013-09-11 ENCOUNTER — Ambulatory Visit: Payer: Managed Care, Other (non HMO) | Admitting: Internal Medicine

## 2013-09-11 ENCOUNTER — Telehealth: Payer: Self-pay | Admitting: *Deleted

## 2013-09-11 DIAGNOSIS — I10 Essential (primary) hypertension: Secondary | ICD-10-CM

## 2013-09-11 MED ORDER — OLMESARTAN MEDOXOMIL-HCTZ 40-12.5 MG PO TABS
1.0000 | ORAL_TABLET | Freq: Every day | ORAL | Status: DC
Start: 2013-09-11 — End: 2013-10-03

## 2013-09-11 NOTE — Telephone Encounter (Signed)
Pt called and stated that she slipped and fell this morning in the garage.Pt has taken aleve but is still in the a lot of pain. Pt states that her lower back hand and wrist are bothering her. Pt stated that she would like to be seen today.Appointment was made with doc of the day. Patient also states that [facility] not [facility] her Rx for Benicar.Rx has been phones in for a 30-day supply at [facility] and the remaining was sent to mail order.  SW

## 2013-09-19 ENCOUNTER — Telehealth: Payer: Self-pay | Admitting: *Deleted

## 2013-09-19 NOTE — Telephone Encounter (Signed)
Error

## 2013-09-20 MED ORDER — LOSARTAN POTASSIUM-HCTZ 100-25 MG PO TABS: 1.0000 | ORAL_TABLET | Freq: Every day | ORAL | Status: DC

## 2013-09-20 NOTE — Addendum Note (Signed)
Addended by: Arnette Norris on: 09/20/2013 10:49 AM   Modules accepted: Orders

## 2013-10-03 ENCOUNTER — Other Ambulatory Visit: Payer: Self-pay | Admitting: Family Medicine

## 2013-10-03 ENCOUNTER — Telehealth: Payer: Self-pay | Admitting: Internal Medicine

## 2013-10-03 ENCOUNTER — Ambulatory Visit (INDEPENDENT_AMBULATORY_CARE_PROVIDER_SITE_OTHER): Payer: Managed Care, Other (non HMO) | Admitting: Adult Health

## 2013-10-03 ENCOUNTER — Encounter: Payer: Self-pay | Admitting: Adult Health

## 2013-10-03 VITALS — BP 118/80 | HR 75 | Temp 99.1°F | Ht 67.0 in | Wt 198.2 lb

## 2013-10-03 DIAGNOSIS — J45901 Unspecified asthma with (acute) exacerbation: Secondary | ICD-10-CM

## 2013-10-03 MED ORDER — ESCITALOPRAM OXALATE 20 MG PO TABS: ORAL_TABLET | ORAL | Status: DC

## 2013-10-03 MED ORDER — PREDNISONE 10 MG PO TABS: ORAL_TABLET | ORAL | Status: DC

## 2013-10-03 MED ORDER — BUDESONIDE-FORMOTEROL FUMARATE 160-4.5 MCG/ACT IN AERO: 2.0000 | INHALATION_SPRAY | Freq: Two times a day (BID) | RESPIRATORY_TRACT | Status: DC

## 2013-10-03 MED ORDER — CEFDINIR 300 MG PO CAPS: 300.0000 mg | ORAL_CAPSULE | Freq: Two times a day (BID) | ORAL | Status: DC

## 2013-10-03 NOTE — Telephone Encounter (Signed)
Ok to refill  Months No pharmacy given

## 2013-10-03 NOTE — Telephone Encounter (Signed)
She is not a pt of Dr. Felicity Coyer. Per chart pt see Dr. Laury Axon @ [address]. Will forward msg...Raechel Chute

## 2013-10-03 NOTE — Patient Instructions (Signed)
Omnicef 300mg  Twice daily  For 7 days  Mucinex DM Twice daily  As needed  Cough/congestion  Saline nasal rinses As needed   Fluids and rest  Prednisone 20mg  daily x 2 days , 10mg  daily x 2 day then 5mg  daily x 2 days and stop  Please contact office for sooner follow up if symptoms do not improve or worsen or seek emergency care

## 2013-10-03 NOTE — Telephone Encounter (Signed)
Pt seen today by Rubye Oaks for acute visit She is requesting a refill on her Lexapro 20mg  Thank you

## 2013-10-03 NOTE — Telephone Encounter (Signed)
Ok to refill med--- pt no

## 2013-10-03 NOTE — Telephone Encounter (Signed)
Rx faxed.    KP 

## 2013-10-04 ENCOUNTER — Encounter: Payer: Self-pay | Admitting: Adult Health

## 2013-10-04 MED ORDER — CEFDINIR 300 MG PO CAPS: 300.0000 mg | ORAL_CAPSULE | Freq: Two times a day (BID) | ORAL | Status: DC

## 2013-10-04 MED ORDER — PREDNISONE 10 MG PO TABS: ORAL_TABLET | ORAL | Status: DC

## 2013-10-04 NOTE — Assessment & Plan Note (Signed)
Flare with URI   Plan  Omnicef 300mg  Twice daily  For 7 days  Mucinex DM Twice daily  As needed  Cough/congestion  Saline nasal rinses As needed   Fluids and rest  Prednisone 20mg  daily x 2 days , 10mg  daily x 2 day then 5mg  daily x 2 days and stop  Please contact office for sooner follow up if symptoms do not improve or worsen or seek emergency care

## 2013-10-04 NOTE — Progress Notes (Signed)
Subjective:    Patient ID: Carolyn Hebert, female    DOB: 05-Apr-1976, 37 y.o.   MRN: [id number]  HPI  37 year old remote smoker who presents for evaluation of recurrent sinobronchitis and asthma. She just moved from [place] to [place] and I have reviewed records from her ENT physicians and pulmonologist in [place].  She had 4-5 primary care visits since September requiring multiple courses of antibiotics, nebulizer treatment in the office with some relief of symptoms. and a long course of prednisone. This episode started off as a URI illness that she probably contracted from her 56-year-old daughter. She carries a history of allergic rhinitis, chronic sinusitis and bronchitis. She underwent bilateral endoscopic sinus surgery in August 2011 but continued to have recurrent sinusitis. Cultures at one point did show Streptococcus pneumonia and pseudomonas. The possibility of long-term suppressive antibiotics such as twice weekly azithromycin was considered. She was on allergy shots. IgE levels were 40 6/12 and other immunoglobulin levels were normal.  He generally requires a smaller dose but longer course of prednisone.  Spirometry today showed FEV1 of 59% and FVC of 83% suggestive of moderate airway obstruction.  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 She does have 2 dogs and a cat at home.    04/23/2013 65m FU On last visit feb, changed from advair  to symbicort - voice/ hoarseness improved pt states breathing is good .sob upon some activity(stairs) pt has Sore thorat . denies any fever.  Given prednisone in 02/2013 for sinusitis with chest tightness On prevacid for GERD Did not take symbicort this am Spirometry shows Fev1 78% - improved No nocturnal symptoms rec Lung function at 78% - improved Stay on symbicort 2 puffs twice daily  07/25/2013  Acute w/in ov/Wert "never completely better since last  ov" Chief Complaint  Patient presents with  . Acute Visit    Pt reports dxed with bronchitis 07/23/13- She states that this is her second time having bronchitis within one month. She c/o prod cough with moderate green to yellow sputum and increased SOB for the past wk.   aburptly ill  8/23 by 8/26 seen and started on prednisone and not really better yet  Mucus green Baseline symbicort 2 every 12 plus prev 15 and no need for saba, but now saba an neb and still not better assoc with sore throat and hoarsness  >>  10/03/13 Acute OV  Complains of prod cough with green mucus, fatigue, body aches, increased SOB, wheezing, HA, chest tightness/achiness x5 day.  OTC not working Some nasal congestion but mainly chest congestion.  Some wheezing.  No recent travel or abx use.  No chest pain , hemoptysis, edema or n/v/d.    Current Medications, Allergies, Past Medical History, Past Surgical History, Family History, and Social History were reviewed in [facility] record.  ROS  The following are not active complaints unless bolded sore throat, dysphagia, dental problems, itching, sneezing,  nasal congestion or excess/ purulent secretions, ear ache,   fever, chills, sweats, unintended wt loss, pleuritic or exertional cp, hemoptysis,  orthopnea pnd or leg swelling, presyncope, palpitations, heartburn, abdominal pain, anorexia, nausea, vomiting, diarrhea  or change in bowel or urinary habits, change in stools or urine, dysuria,hematuria,  rash, arthralgias, visual complaints, headache, numbness weakness or ataxia or problems with walking or coordination,  change in mood/affect or memory.  Objective:   Physical Exam  Gen. Pleasant, well-nourished, in no distress   HEENT: nl dentition, turbinates, and orophanx. Nl external ear canals without cough reflex   NECK :  without JVD/Nodes/TM/ nl carotid upstrokes bilaterally   LUNGS: no acc muscle use, clear to A and P  bilaterally without cough on insp or exp maneuvers   CV:  RRR  no s3 or murmur or increase in P2, no edema   ABD:  soft and nontender with nl excursion in the supine position. No bruits or organomegaly, bowel sounds nl  MS:  warm without deformities, calf tenderness, cyanosis or clubbing  SKIN: warm and dry without lesions    NEURO:  alert, approp, no deficits         Assessment & Plan:

## 2013-10-04 NOTE — Telephone Encounter (Signed)
I spoke with pt. She reports RX ws originally sent to [facility] it is showing this. I advised will call to cancel this I spoke with Cala Bradford from [facility] and she will reverse the claim. Local insurance should be able to process this now.  I called [facility] and RX went through. Nothing further needed

## 2013-10-04 NOTE — Telephone Encounter (Signed)
I spoke with the pharmacy and they received both rx. They have the prednisone ready but they stated that the omnicef was denied because system states it was filled at another pharmacy, not a [facility], on 10-03-13? I have LMTCBx1 to speak with the pt about this. Carron Curie, CMA

## 2013-10-07 ENCOUNTER — Encounter: Payer: Self-pay | Admitting: Adult Health

## 2013-10-07 MED ORDER — HYDROCODONE-HOMATROPINE 5-1.5 MG/5ML PO SYRP: ORAL_SOLUTION | ORAL | Status: DC

## 2013-10-07 NOTE — Telephone Encounter (Signed)
Per TP: okay for Hydromet #8oz, 1-2 tsp every 4-6 hours as needed for cough.  May make you sleepy.  Thanks.  Called spoke with patient, apologized for the delay and inconvenience.  Advised pt that all rx cough syrups contain hydrocodone and this must be picked up.  Pt okay with this and verbalized her understanding - rx sent with TP to the [facility] office for pt to pick this up.  Nothing further needed at this time.  Will sign off.

## 2013-10-07 NOTE — Telephone Encounter (Signed)
I spoke with pt. She reports she is still coughing a lot. Brings up yellow-green phlem and having some chest tx. She is still on abx and prednisone. She is taking delsym but it is not helping her cough at all. She is requesting to have something called in for her cough. Please advise TP thanks  Allergies  Allergen Reactions  . Penicillins     hives

## 2013-10-11 ENCOUNTER — Encounter: Payer: Self-pay | Admitting: Family Medicine

## 2013-10-11 ENCOUNTER — Ambulatory Visit: Payer: Managed Care, Other (non HMO) | Admitting: Family Medicine

## 2013-10-11 ENCOUNTER — Ambulatory Visit (INDEPENDENT_AMBULATORY_CARE_PROVIDER_SITE_OTHER): Payer: Managed Care, Other (non HMO) | Admitting: Family Medicine

## 2013-10-11 VITALS — BP 120/88 | HR 82 | Temp 98.1°F | Wt 198.4 lb

## 2013-10-11 DIAGNOSIS — I10 Essential (primary) hypertension: Secondary | ICD-10-CM

## 2013-10-11 MED ORDER — LOSARTAN POTASSIUM-HCTZ 100-25 MG PO TABS: 1.0000 | ORAL_TABLET | Freq: Every day | ORAL | Status: DC

## 2013-10-11 NOTE — Progress Notes (Signed)
  Subjective:    Patient here for follow-up of elevated blood pressure.  She is not exercising and is adherent to a low-salt diet.  Blood pressure is well controlled at home. Cardiac symptoms: none. Patient denies: chest pain, chest pressure/discomfort, claudication, dyspnea, exertional chest pressure/discomfort, fatigue, irregular heart beat, lower extremity edema, near-syncope, orthopnea, palpitations, paroxysmal nocturnal dyspnea, syncope and tachypnea. Cardiovascular risk factors: hypertension, obesity (BMI >= 30 kg/m2) and sedentary lifestyle. Use of agents associated with hypertension: none. History of target organ damage: none.  The following portions of the patient's history were reviewed and updated as appropriate: allergies, current medications, past family history, past medical history, past social history, past surgical history and problem list.  Review of Systems Pertinent items are noted in HPI.     Objective:    BP 120/88  Pulse 82  Temp(Src) 98.1 F (36.7 C) (Oral)  Wt 198 lb 6.4 oz (89.994 kg)  SpO2 97% General appearance: alert, cooperative, appears stated age and no distress Neck: no adenopathy, no carotid bruit, no JVD, supple, symmetrical, trachea midline and thyroid not enlarged, symmetric, no tenderness/mass/nodules Lungs: clear to auscultation bilaterally Heart: S1, S2 normal Extremities: extremities normal, atraumatic, no cyanosis or edema    Assessment:    Hypertension, normal blood pressure -slightly elevated today. Evidence of target organ damage: none.    Plan:    Medication: no change. Dietary sodium restriction. Regular aerobic exercise. Check blood pressures 2-3 times weekly and record. Follow up: 3 months and as needed.

## 2013-10-11 NOTE — Progress Notes (Signed)
Pre visit review using our clinic review tool, if applicable. No additional management support is needed unless otherwise documented below in the visit note. 

## 2013-10-11 NOTE — Patient Instructions (Signed)
  rto 3 months for bp or sooner prn   Hypertension As your heart beats, it forces blood through your arteries. This force is your blood pressure. If the pressure is too high, it is called hypertension (HTN) or high blood pressure. HTN is dangerous because you may have it and not know it. High blood pressure may mean that your heart has to work harder to pump blood. Your arteries may be narrow or stiff. The extra work puts you at risk for heart disease, stroke, and other problems.  Blood pressure consists of two numbers, a higher number over a lower, 110/72, for example. It is stated as "110 over 72." The ideal is below 120 for the top number (systolic) and under 80 for the bottom (diastolic). Write down your blood pressure today. You should pay close attention to your blood pressure if you have certain conditions such as:  Heart failure.  Prior heart attack.  Diabetes  Chronic kidney disease.  Prior stroke.  Multiple risk factors for heart disease. To see if you have HTN, your blood pressure should be measured while you are seated with your arm held at the level of the heart. It should be measured at least twice. A one-time elevated blood pressure reading (especially in the Emergency Department) does not mean that you need treatment. There may be conditions in which the blood pressure is different between your right and left arms. It is important to see your caregiver soon for a recheck. Most people have essential hypertension which means that there is not a specific cause. This type of high blood pressure may be lowered by changing lifestyle factors such as:  Stress.  Smoking.  Lack of exercise.  Excessive weight.  Drug/tobacco/alcohol use.  Eating less salt. Most people do not have symptoms from high blood pressure until it has caused damage to the body. Effective treatment can often prevent, delay or reduce that damage. TREATMENT  When a cause has been identified, treatment for  high blood pressure is directed at the cause. There are a large number of medications to treat HTN. These fall into several categories, and your caregiver will help you select the medicines that are best for you. Medications may have side effects. You should review side effects with your caregiver. If your blood pressure stays high after you have made lifestyle changes or started on medicines,   Your medication(s) may need to be changed.  Other problems may need to be addressed.  Be certain you understand your prescriptions, and know how and when to take your medicine.  Be sure to follow up with your caregiver within the time frame advised (usually within two weeks) to have your blood pressure rechecked and to review your medications.  If you are taking more than one medicine to lower your blood pressure, make sure you know how and at what times they should be taken. Taking two medicines at the same time can result in blood pressure that is too low. SEEK IMMEDIATE MEDICAL CARE IF:  You develop a severe headache, blurred or changing vision, or confusion.  You have unusual weakness or numbness, or a faint feeling.  You have severe chest or abdominal pain, vomiting, or breathing problems. MAKE SURE YOU:   Understand these instructions.  Will watch your condition.  Will get help right away if you are not doing well or get worse. Document Released: 11/14/2005 Document Revised: 02/06/2012 Document Reviewed: 07/04/2008 [facility] Patient Information 2014 [place], [place].

## 2013-10-12 DIAGNOSIS — E669 Obesity, unspecified: Secondary | ICD-10-CM | POA: Insufficient documentation

## 2013-10-30 ENCOUNTER — Ambulatory Visit: Payer: Managed Care, Other (non HMO) | Admitting: Adult Health

## 2013-11-06 ENCOUNTER — Encounter: Payer: Self-pay | Admitting: Family Medicine

## 2013-11-18 ENCOUNTER — Ambulatory Visit (INDEPENDENT_AMBULATORY_CARE_PROVIDER_SITE_OTHER): Payer: Managed Care, Other (non HMO) | Admitting: Family

## 2013-11-18 ENCOUNTER — Encounter: Payer: Self-pay | Admitting: Family

## 2013-11-18 VITALS — BP 112/80 | HR 75 | Temp 99.0°F | Resp 16 | Ht 67.0 in | Wt 193.0 lb

## 2013-11-18 DIAGNOSIS — R42 Dizziness and giddiness: Secondary | ICD-10-CM

## 2013-11-18 MED ORDER — DOXYCYCLINE HYCLATE 100 MG PO TABS: 100.0000 mg | ORAL_TABLET | Freq: Two times a day (BID) | ORAL | Status: DC

## 2013-11-18 NOTE — Patient Instructions (Signed)
Continue flonase, do nasal saline rinses as you are able. Add Allegra D which is available over the counter. You may also use mucinex to help thin secretions. If symptoms worsen, or if not improved by Friday you can start doxycycline.

## 2013-11-18 NOTE — Assessment & Plan Note (Addendum)
New. Suspect secondary to sinus congestion/possible inner ear fluid in setting of URI.  Advised pt to try allegra D, prn mucinex. Start doxy if symptoms worsen, or if not improved in 2-3 days. Pt verbalizes understanding.

## 2013-11-18 NOTE — Progress Notes (Signed)
Subjective:    Patient ID: Carolyn Hebert, female    DOB: [birth year], 37 y.o.   MRN: [id number]  HPI  Pt presents today with chief complaint of dizziness. Woke up on Thursday with "bad cold."  Then cold symptoms resolved.  Developed dizziness yesterday. Today dizziness has worse.  Dizziness is worse with moving.  She reports + sinus pressure.  She reports mild ear discomfort.  Has not check her temperature at home.  She has tried otc mucinex and vitamin D and C. No improvement with these measures.  Reports one episode of vertigo as a teenager.     Review of Systems    see HPI  Past Medical History  Diagnosis Date  . Ovarian cyst   . LGSIL (low grade squamous intraepithelial lesion) on Pap smear   . Anxiety   . Depression   . IBS (irritable bowel syndrome)   . Dysplastic nevus   . HSV-2 infection   . Abnormal Pap smear     History   Social History  . Marital Status: Married    Spouse Name: N/A    Number of Children: 1  . Years of Education: N/A   Occupational History  . sales    Social History Main Topics  . Smoking status: Former Smoker -- 7 years    Types: Cigarettes    Quit date: 11/29/1999  . Smokeless tobacco: Never Used     Comment: "couple of cigs on weekend"  . Alcohol Use: Yes     Comment: 2-3 beers per week  . Drug Use: No  . Sexual Activity: Yes    Birth Control/ Protection: Pill   Other Topics Concern  . Not on file   Social History Narrative  . No narrative on file    Past Surgical History  Procedure Laterality Date  . Cystectomy    . Cesarean section    . Laparoscopy    . Wisdom tooth extraction    . Septoplasty with ethmoidectomy, and maxillary antrostomy  2011     [place] , [place]    Family History  Problem Relation Age of Onset  . Cancer Maternal Aunt   . Cancer Maternal Grandfather     prostate/lung  . Hypertension Father   . Hyperlipidemia Mother   . Cancer Maternal Uncle     melanoma    Allergies  Allergen Reactions  .  Penicillins     hives    Current Outpatient Prescriptions on File Prior to Visit  Medication Sig Dispense Refill  . albuterol (PROVENTIL HFA;VENTOLIN HFA) 108 (90 BASE) MCG/ACT inhaler Inhale 2 puffs into the lungs 4 (four) times daily.  1 Inhaler  2  . budesonide-formoterol (SYMBICORT) 160-4.5 MCG/ACT inhaler Inhale 2 puffs into the lungs 2 (two) times daily.  3 Inhaler  3  . escitalopram (LEXAPRO) 20 MG tablet TAKE 1 TABLET (20 MG TOTAL) BY MOUTH DAILY.  30 tablet  4  . fexofenadine (ALLEGRA) 180 MG tablet Take 1 tablet (180 mg total) by mouth daily.  90 tablet  1  . fluticasone (FLONASE) 50 MCG/ACT nasal spray PLACE 1 SPRAY INTO THE NOSE 2 (TWO) TIMES DAILY AS NEEDED FOR RHINITIS.  48 g  3  . losartan-hydrochlorothiazide (HYZAAR) 100-25 MG per tablet Take 1 tablet by mouth daily.  90 tablet  3  . montelukast (SINGULAIR) 10 MG tablet Take 1 tablet (10 mg total) by mouth at bedtime.  90 tablet  3  . Olopatadine HCl (PATADAY) 0.2 % SOLN  Apply to eye.      . thyroid (ARMOUR) 32.5 MG tablet Take 32.5 mg by mouth 2 (two) times daily.       No current facility-administered medications on file prior to visit.    BP 112/80  Pulse 75  Temp(Src) 99 F (37.2 C) (Oral)  Resp 16  Ht 5\' 7"  (1.702 m)  Wt 193 lb (87.544 kg)  BMI 30.22 kg/m2  SpO2 99%  LMP 11/13/2013    Objective:   Physical Exam  Constitutional: She appears well-developed and well-nourished. No distress.  HENT:  Head: Normocephalic and atraumatic.  Right Ear: Tympanic membrane and ear canal normal.  Left Ear: Tympanic membrane and ear canal normal.  Mouth/Throat: No oropharyngeal exudate, posterior oropharyngeal edema or posterior oropharyngeal erythema.  + maxillary sinus tenderness to palpation  Cardiovascular: Normal rate and regular rhythm.   No murmur heard. Pulmonary/Chest: Effort normal and breath sounds normal. No respiratory distress. She has no wheezes. She has no rales. She exhibits no tenderness.           Assessment & Plan:

## 2013-11-18 NOTE — Progress Notes (Signed)
Pre visit review using our clinic review tool, if applicable. No additional management support is needed unless otherwise documented below in the visit note. 

## 2013-12-30 ENCOUNTER — Ambulatory Visit (INDEPENDENT_AMBULATORY_CARE_PROVIDER_SITE_OTHER): Payer: Managed Care, Other (non HMO) | Admitting: Adult Health

## 2013-12-30 ENCOUNTER — Encounter: Payer: Self-pay | Admitting: Adult Health

## 2013-12-30 VITALS — BP 140/94 | HR 69 | Temp 97.1°F | Ht 67.0 in | Wt 186.2 lb

## 2013-12-30 DIAGNOSIS — I10 Essential (primary) hypertension: Secondary | ICD-10-CM

## 2013-12-30 DIAGNOSIS — J302 Other seasonal allergic rhinitis: Secondary | ICD-10-CM

## 2013-12-30 DIAGNOSIS — J309 Allergic rhinitis, unspecified: Secondary | ICD-10-CM

## 2013-12-30 DIAGNOSIS — J45909 Unspecified asthma, uncomplicated: Secondary | ICD-10-CM

## 2013-12-30 NOTE — Patient Instructions (Signed)
Continue on current regimen  follow up Dr. Alva  In 6 months and As needed    

## 2014-01-02 NOTE — Progress Notes (Signed)
Subjective:    Patient ID: Carolyn Hebert, female    DOB: Apr 25, 1976, 38 y.o.   MRN: [id number]  HPI 38 year old remote smoker who presents for evaluation of recurrent sinobronchitis and asthma. She just moved from [place] to [place] and I have reviewed records from her ENT physicians and pulmonologist in [place].  She had 4-5 primary care visits since September requiring multiple courses of antibiotics, nebulizer treatment in the office with some relief of symptoms. and a long course of prednisone. This episode started off as a URI illness that she probably contracted from her 89-year-old daughter. She carries a history of allergic rhinitis, chronic sinusitis and bronchitis. She underwent bilateral endoscopic sinus surgery in August 2011 but continued to have recurrent sinusitis. Cultures at one point did show Streptococcus pneumonia and pseudomonas. The possibility of long-term suppressive antibiotics such as twice weekly azithromycin was considered. She was on allergy shots. IgE levels were 40 6/12 and other immunoglobulin levels were normal.  He generally requires a smaller dose but longer course of prednisone.  Spirometry today showed FEV1 of 59% and FVC of 83% suggestive of moderate airway obstruction.  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 She does have 2 dogs and a cat at home.    04/23/2013 89m FU On last visit feb, changed from advair  to symbicort - voice/ hoarseness improved pt states breathing is good .sob upon some activity(stairs) pt has Sore thorat . denies any fever.  Given prednisone in 02/2013 for sinusitis with chest tightness On prevacid for GERD Did not take symbicort this am Spirometry shows Fev1 78% - improved No nocturnal symptoms rec Lung function at 78% - improved Stay on symbicort 2 puffs twice daily  07/25/2013  Acute w/in ov/Wert "never completely better since last ov" Chief  Complaint  Patient presents with  . Acute Visit    Pt reports dxed with bronchitis 07/23/13- She states that this is her second time having bronchitis within one month. She c/o prod cough with moderate green to yellow sputum and increased SOB for the past wk.   aburptly ill  8/23 by 8/26 seen and started on prednisone and not really better yet  Mucus green Baseline symbicort 2 every 12 plus prev 15 and no need for saba, but now saba an neb and still not better assoc with sore throat and hoarsness  >>  10/03/13 Acute OV  Complains of prod cough with green mucus, fatigue, body aches, increased SOB, wheezing, HA, chest tightness/achiness x5 day.  OTC not working Some nasal congestion but mainly chest congestion.  Some wheezing.  No recent travel or abx use.  No chest pain , hemoptysis, edema or n/v/d.   >omnicef and pred taper   12/30/13  Follow up  Returns for 6 month follow up Asthma.  Reports breathing is doing well since last.  Still having some lingering dry cough.   Remains on symbicort. No increased SABA use.  No ER/hospital visits. Denies dyspnea, wheezing, chest tightness, hemoptysis, nausea, vomiting, edema.   Current Medications, Allergies, Past Medical History, Past Surgical History, Family History, and Social History were reviewed in [employer] [facility] record.  ROS  The following are not active complaints unless bolded sore throat, dysphagia, dental problems, itching, sneezing,  or excess/ purulent secretions, ear ache,   fever, chills, sweats, unintended wt loss, pleuritic or exertional cp, hemoptysis,  orthopnea pnd or leg swelling, presyncope, palpitations, heartburn,  abdominal pain, anorexia, nausea, vomiting, diarrhea  or change in bowel or urinary habits, change in stools or urine, dysuria,hematuria,  rash, arthralgias, visual complaints, headache, numbness weakness or ataxia or problems with walking or coordination,  change in mood/affect or memory.              Objective:   Physical Exam  Gen. Pleasant, well-nourished, in no distress   HEENT: nl dentition, turbinates, and orophanx. Nl external ear canals without cough reflex   NECK :  without JVD/Nodes/TM/ nl carotid upstrokes bilaterally   LUNGS: no acc muscle use, clear to A and P bilaterally without cough on insp or exp maneuvers   CV:  RRR  no s3 or murmur or increase in P2, no edema   ABD:  soft and nontender with nl excursion in the supine position. No bruits or organomegaly, bowel sounds nl  MS:  warm without deformities, calf tenderness, cyanosis or clubbing  SKIN: warm and dry without lesions    NEURO:  alert, approp, no deficits         Assessment & Plan:

## 2014-01-02 NOTE — Assessment & Plan Note (Signed)
Compensated on present regimen  Cont on current regimen .  

## 2014-01-02 NOTE — Assessment & Plan Note (Signed)
Cont on current regimen  Advised on trigger control

## 2014-01-06 ENCOUNTER — Encounter: Payer: Self-pay | Admitting: Family Medicine

## 2014-01-16 ENCOUNTER — Ambulatory Visit: Payer: Private Health Insurance - Indemnity | Admitting: Family Medicine

## 2014-01-23 ENCOUNTER — Ambulatory Visit: Payer: Managed Care, Other (non HMO) | Admitting: Family Medicine

## 2014-01-31 ENCOUNTER — Encounter: Payer: Self-pay | Admitting: Family Medicine

## 2014-01-31 ENCOUNTER — Ambulatory Visit (INDEPENDENT_AMBULATORY_CARE_PROVIDER_SITE_OTHER): Payer: Managed Care, Other (non HMO) | Admitting: Family Medicine

## 2014-01-31 VITALS — HR 69 | Temp 99.3°F | Wt 180.6 lb

## 2014-01-31 DIAGNOSIS — F32A Depression, unspecified: Secondary | ICD-10-CM

## 2014-01-31 DIAGNOSIS — I10 Essential (primary) hypertension: Secondary | ICD-10-CM

## 2014-01-31 DIAGNOSIS — F3289 Other specified depressive episodes: Secondary | ICD-10-CM

## 2014-01-31 DIAGNOSIS — F329 Major depressive disorder, single episode, unspecified: Secondary | ICD-10-CM

## 2014-01-31 MED ORDER — ESCITALOPRAM OXALATE 20 MG PO TABS: ORAL_TABLET | ORAL | Status: DC

## 2014-01-31 MED ORDER — LOSARTAN POTASSIUM-HCTZ 100-25 MG PO TABS: 1.0000 | ORAL_TABLET | Freq: Every day | ORAL | Status: DC

## 2014-01-31 NOTE — Progress Notes (Signed)
Pre visit review using our clinic review tool, if applicable. No additional management support is needed unless otherwise documented below in the visit note. 

## 2014-01-31 NOTE — Progress Notes (Signed)
  Subjective:    Patient here for follow-up of elevated blood pressure.  She is not exercising and is adherent to a low-salt diet.  Blood pressure is well controlled at home. Cardiac symptoms: none. Patient denies: chest pain, chest pressure/discomfort, claudication, dyspnea, exertional chest pressure/discomfort, fatigue, irregular heart beat, lower extremity edema, near-syncope, orthopnea, palpitations, paroxysmal nocturnal dyspnea, syncope and tachypnea. Cardiovascular risk factors: hypertension and sedentary lifestyle. Use of agents associated with hypertension: none. History of target organ damage: none.  The following portions of the patient's history were reviewed and updated as appropriate: allergies, current medications, past family history, past medical history, past social history, past surgical history and problem list.  Review of Systems Pertinent items are noted in HPI.     Objective:    Pulse 69  Temp(Src) 99.3 F (37.4 C) (Oral)  Wt 180 lb 9.6 oz (81.92 kg)  SpO2 97% General appearance: alert, cooperative, appears stated age and no distress Neck: no adenopathy, supple, symmetrical, trachea midline and thyroid not enlarged, symmetric, no tenderness/mass/nodules Lungs: clear to auscultation bilaterally Heart: S1, S2 normal Extremities: extremities normal, atraumatic, no cyanosis or edema    Assessment:    Hypertension, normal blood pressure . Evidence of target organ damage: none.    Plan:    Medication: no change. Regular aerobic exercise. Check blood pressures 2-3 times weekly and record. Follow up: 3 months and as needed.

## 2014-01-31 NOTE — Patient Instructions (Signed)

## 2014-02-01 ENCOUNTER — Telehealth: Payer: Self-pay | Admitting: Family Medicine

## 2014-02-01 NOTE — Telephone Encounter (Signed)
Relevant patient education assigned to patient using Emmi. ° °

## 2014-02-03 ENCOUNTER — Telehealth: Payer: Self-pay | Admitting: Family Medicine

## 2014-02-03 NOTE — Telephone Encounter (Signed)
Relevant patient education assigned to patient using Emmi. ° °

## 2014-02-25 ENCOUNTER — Encounter: Payer: Self-pay | Admitting: Family Medicine

## 2014-02-25 ENCOUNTER — Ambulatory Visit (INDEPENDENT_AMBULATORY_CARE_PROVIDER_SITE_OTHER): Payer: Private Health Insurance - Indemnity | Admitting: Family Medicine

## 2014-02-25 VITALS — BP 120/70 | HR 78 | Temp 98.4°F | Wt 180.2 lb

## 2014-02-25 DIAGNOSIS — R5381 Other malaise: Secondary | ICD-10-CM

## 2014-02-25 DIAGNOSIS — R5383 Other fatigue: Principal | ICD-10-CM

## 2014-02-25 LAB — POCT INFLUENZA A/B
Influenza A, POC: NEGATIVE
Influenza B, POC: NEGATIVE

## 2014-02-25 MED ORDER — OSELTAMIVIR PHOSPHATE 75 MG PO CAPS: 75.0000 mg | ORAL_CAPSULE | Freq: Two times a day (BID) | ORAL | Status: DC

## 2014-02-25 NOTE — Progress Notes (Signed)
Subjective:     Carolyn Hebert is a 38 y.o. female who presents for evaluation of fatigue. Symptoms began a few days ago. The patient feels the fatigue began with: nothing imparticular. Symptoms of her fatigue have been general malaise. Patient describes the following psychological symptoms: none. Patient denies change in hair texture, cold intolerance, constipation and exercise intolerance. Symptoms have gradually worsened. Symptom severity: struggles to carry out day to day responsibilities.. Previous visits for this problem: none.   The following portions of the patient's history were reviewed and updated as appropriate:  She  has a past medical history of Ovarian cyst; LGSIL (low grade squamous intraepithelial lesion) on Pap smear; Anxiety; Depression; IBS (irritable bowel syndrome); Dysplastic nevus; HSV-2 infection; and Abnormal Pap smear. She  does not have any pertinent problems on file. She  has past surgical history that includes Cystectomy; Cesarean section; laparoscopy; Wisdom tooth extraction; and Septoplasty with ethmoidectomy, and maxillary antrostomy (2011). Her family history includes Cancer in her maternal aunt, maternal grandfather, and maternal uncle; Hyperlipidemia in her mother; Hypertension in her father. She  reports that she quit smoking about 14 years ago. Her smoking use included Cigarettes. She smoked 0.00 packs per day for 7 years. She has never used smokeless tobacco. She reports that she drinks alcohol. She reports that she does not use illicit drugs. She has a current medication list which includes the following prescription(s): albuterol, budesonide-formoterol, escitalopram, fexofenadine, fluticasone, losartan-hydrochlorothiazide, montelukast, olopatadine hcl, thyroid, and oseltamivir. Current Outpatient Prescriptions on File Prior to Visit  Medication Sig Dispense Refill  . albuterol (PROVENTIL HFA;VENTOLIN HFA) 108 (90 BASE) MCG/ACT inhaler Inhale 2 puffs into the  lungs 4 (four) times daily.  1 Inhaler  2  . budesonide-formoterol (SYMBICORT) 160-4.5 MCG/ACT inhaler Inhale 2 puffs into the lungs 2 (two) times daily.  3 Inhaler  3  . escitalopram (LEXAPRO) 20 MG tablet TAKE 1 TABLET (20 MG TOTAL) BY MOUTH DAILY.  90 tablet  3  . fexofenadine (ALLEGRA) 180 MG tablet Take 1 tablet (180 mg total) by mouth daily.  90 tablet  1  . fluticasone (FLONASE) 50 MCG/ACT nasal spray PLACE 1 SPRAY INTO THE NOSE 2 (TWO) TIMES DAILY AS NEEDED FOR RHINITIS.  48 g  3  . losartan-hydrochlorothiazide (HYZAAR) 100-25 MG per tablet Take 1 tablet by mouth daily.  90 tablet  3  . montelukast (SINGULAIR) 10 MG tablet Take 1 tablet (10 mg total) by mouth at bedtime.  90 tablet  3  . Olopatadine HCl (PATADAY) 0.2 % SOLN Apply to eye.      . thyroid (ARMOUR) 32.5 MG tablet Take 32.5 mg by mouth 2 (two) times daily.       No current facility-administered medications on file prior to visit.   She is allergic to penicillins..  Review of Systems Pertinent items are noted in HPI.    Objective:    BP 120/70  Pulse 78  Temp(Src) 98.4 F (36.9 C) (Oral)  Wt 180 lb 3.2 oz (81.738 kg)  SpO2 97% General appearance: alert, cooperative and no distress Ears: normal TM's and external ear canals both ears Nose: Nares normal. Septum midline. Mucosa normal. No drainage or sinus tenderness. Throat: lips, mucosa, and tongue normal; teeth and gums normal Neck: moderate anterior cervical adenopathy, supple, symmetrical, trachea midline and thyroid not enlarged, symmetric, no tenderness/mass/nodules Lungs: clear to auscultation bilaterally Heart: S1, S2 normal Extremities: extremities normal, atraumatic, no cyanosis or edema    Iflu test -- neg Assessment:  fatigue--? mono    Plan:    Reassured that serious underlying cause for the fatigue is very unlikely. See orders for lab evaluation. f/u prn

## 2014-02-25 NOTE — Progress Notes (Signed)
Pre visit review using our clinic review tool, if applicable. No additional management support is needed unless otherwise documented below in the visit note. 

## 2014-02-25 NOTE — Patient Instructions (Signed)

## 2014-02-26 ENCOUNTER — Encounter: Payer: Self-pay | Admitting: Family Medicine

## 2014-02-26 DIAGNOSIS — E871 Hypo-osmolality and hyponatremia: Secondary | ICD-10-CM

## 2014-02-26 LAB — BASIC METABOLIC PANEL
BUN: 9 mg/dL (ref 6–23)
CO2: 29 mEq/L (ref 19–32)
CREATININE: 0.7 mg/dL (ref 0.4–1.2)
Calcium: 9.1 mg/dL (ref 8.4–10.5)
Chloride: 87 mEq/L — ABNORMAL LOW (ref 96–112)
GFR: 102.43 mL/min (ref >60.00)
GLUCOSE: 97 mg/dL (ref 70–99)
Potassium: 3.8 mEq/L (ref 3.5–5.1)
Sodium: 122 mEq/L — ABNORMAL LOW (ref 135–145)

## 2014-02-26 LAB — CBC WITH DIFFERENTIAL/PLATELET
Basophils Absolute: 0 10*3/uL (ref 0.0–0.1)
Basophils Relative: 0.3 % (ref 0.0–3.0)
EOS PCT: 1 % (ref 0.0–5.0)
Eosinophils Absolute: 0.1 10*3/uL (ref 0.0–0.7)
HCT: 40.5 % (ref 36.0–46.0)
Hemoglobin: 13.9 g/dL (ref 12.0–15.0)
LYMPHS PCT: 22.4 % (ref 12.0–46.0)
Lymphs Abs: 1.8 10*3/uL (ref 0.7–4.0)
MCHC: 34.3 g/dL (ref 30.0–36.0)
MCV: 91.8 fl (ref 78.0–100.0)
Monocytes Absolute: 0.5 10*3/uL (ref 0.1–1.0)
Monocytes Relative: 5.9 % (ref 3.0–12.0)
Neutro Abs: 5.6 10*3/uL (ref 1.4–7.7)
Neutrophils Relative %: 70.4 % (ref 43.0–77.0)
Platelets: 269 10*3/uL (ref 150.0–400.0)
RBC: 4.42 Mil/uL (ref 3.87–5.11)
RDW: 12.1 % (ref 11.5–14.6)
WBC: 8 10*3/uL (ref 4.5–10.5)

## 2014-02-26 LAB — HEPATIC FUNCTION PANEL
ALBUMIN: 4.3 g/dL (ref 3.5–5.2)
ALK PHOS: 37 U/L — AB (ref 39–117)
ALT: 20 U/L (ref 0–35)
AST: 20 U/L (ref 0–37)
Bilirubin, Direct: 0.1 mg/dL (ref 0.0–0.3)
Total Bilirubin: 0.8 mg/dL (ref 0.3–1.2)
Total Protein: 6.8 g/dL (ref 6.0–8.3)

## 2014-02-26 LAB — EPSTEIN-BARR VIRUS VCA ANTIBODY PANEL
EBV EA IGG: 28.6 U/mL — AB (ref <9.0)
EBV NA IgG: 122 U/mL — ABNORMAL HIGH (ref <18.0)
EBV VCA IgG: 450 U/mL — ABNORMAL HIGH (ref <18.0)

## 2014-02-26 LAB — MONONUCLEOSIS SCREEN: MONO SCREEN: NEGATIVE

## 2014-03-03 ENCOUNTER — Telehealth: Payer: Self-pay

## 2014-03-03 MED ORDER — CLARITHROMYCIN ER 500 MG PO TB24: 500.0000 mg | ORAL_TABLET | Freq: Two times a day (BID) | ORAL | Status: DC

## 2014-03-03 NOTE — Telephone Encounter (Signed)
Message copied by Ewing Schlein on Mon Mar 03, 2014  9:35 AM ------      Message from: Rosalita Chessman      Created: Mon Mar 03, 2014  8:41 AM       Scratch that-- allergic to pcn      biaxin xl 1 po bid for 10 days  ------

## 2014-03-03 NOTE — Telephone Encounter (Signed)
Orders in..     KP 

## 2014-03-03 NOTE — Telephone Encounter (Signed)
Rx sent and message sent making the patient aware.     KP

## 2014-03-03 NOTE — Telephone Encounter (Signed)
Patient called and schedule her lab apt for April 27 th. Please advise with lab orders

## 2014-03-13 ENCOUNTER — Other Ambulatory Visit: Payer: Self-pay | Admitting: Pulmonary Disease

## 2014-03-24 ENCOUNTER — Other Ambulatory Visit (INDEPENDENT_AMBULATORY_CARE_PROVIDER_SITE_OTHER): Payer: 59

## 2014-03-24 DIAGNOSIS — I10 Essential (primary) hypertension: Secondary | ICD-10-CM

## 2014-03-24 LAB — BASIC METABOLIC PANEL
BUN: 10 mg/dL (ref 6–23)
CO2: 25 mEq/L (ref 19–32)
Calcium: 9.4 mg/dL (ref 8.4–10.5)
Chloride: 98 mEq/L (ref 96–112)
Creatinine, Ser: 0.6 mg/dL (ref 0.4–1.2)
GFR: 116.07 mL/min (ref >60.00)
GLUCOSE: 88 mg/dL (ref 70–99)
POTASSIUM: 4.5 meq/L (ref 3.5–5.1)
Sodium: 133 mEq/L — ABNORMAL LOW (ref 135–145)

## 2014-04-09 ENCOUNTER — Encounter: Payer: Self-pay | Admitting: Family Medicine

## 2014-04-23 ENCOUNTER — Other Ambulatory Visit: Payer: Self-pay | Admitting: Family Medicine

## 2014-04-23 NOTE — Telephone Encounter (Signed)
Rx sent to the pharmacy by e-script.//AB/CMA 

## 2014-04-29 ENCOUNTER — Telehealth: Payer: Self-pay | Admitting: Pulmonary Disease

## 2014-04-29 NOTE — Telephone Encounter (Signed)
lmomtcb x1 for pt Does she want 30 or 90 day sent to [facility]?

## 2014-04-30 ENCOUNTER — Encounter: Payer: Self-pay | Admitting: *Deleted

## 2014-04-30 MED ORDER — MONTELUKAST SODIUM 10 MG PO TABS: 10.0000 mg | ORAL_TABLET | Freq: Every day | ORAL | Status: DC

## 2014-04-30 NOTE — Telephone Encounter (Signed)
Spoke with pt  She prefers 90 day supply  Rx was sent to pharm  Nothing further needed

## 2014-04-30 NOTE — Telephone Encounter (Signed)
[email or web address]. Jennifer Castillo, CMA  

## 2014-05-07 ENCOUNTER — Ambulatory Visit: Payer: Managed Care, Other (non HMO) | Admitting: Family Medicine

## 2014-05-16 ENCOUNTER — Ambulatory Visit: Payer: 59 | Admitting: Family Medicine

## 2014-05-23 ENCOUNTER — Ambulatory Visit (INDEPENDENT_AMBULATORY_CARE_PROVIDER_SITE_OTHER): Payer: 59 | Admitting: Family Medicine

## 2014-05-23 ENCOUNTER — Encounter: Payer: Self-pay | Admitting: Family Medicine

## 2014-05-23 VITALS — BP 122/74 | HR 76 | Temp 98.5°F | Wt 177.4 lb

## 2014-05-23 DIAGNOSIS — F32A Depression, unspecified: Secondary | ICD-10-CM

## 2014-05-23 DIAGNOSIS — F329 Major depressive disorder, single episode, unspecified: Secondary | ICD-10-CM

## 2014-05-23 DIAGNOSIS — I10 Essential (primary) hypertension: Secondary | ICD-10-CM

## 2014-05-23 DIAGNOSIS — F3289 Other specified depressive episodes: Secondary | ICD-10-CM

## 2014-05-23 NOTE — Progress Notes (Signed)
  Subjective:    Patient here for follow-up of elevated blood pressure.  She is not exercising and is adherent to a low-salt diet.  Blood pressure is well controlled at home. Cardiac symptoms: none. Patient denies: chest pain, chest pressure/discomfort, claudication, dyspnea, exertional chest pressure/discomfort, fatigue, irregular heart beat, lower extremity edema, near-syncope, orthopnea, palpitations, paroxysmal nocturnal dyspnea, syncope and tachypnea. Cardiovascular risk factors: hypertension and sedentary lifestyle. Use of agents associated with hypertension: none. History of target organ damage: none.  The following portions of the patient's history were reviewed and updated as appropriate: allergies, current medications, past family history, past medical history, past social history, past surgical history and problem list.  Review of Systems Pertinent items are noted in HPI.     Objective:    BP 122/74  Pulse 76  Temp(Src) 98.5 F (36.9 C) (Oral)  Wt 177 lb 6.4 oz (80.468 kg)  SpO2 98% General appearance: alert, cooperative, appears stated age and no distress Throat: lips, mucosa, and tongue normal; teeth and gums normal Neck: no adenopathy, supple, symmetrical, trachea midline and thyroid not enlarged, symmetric, no tenderness/mass/nodules Lungs: clear to auscultation bilaterally Heart: S1, S2 normal Extremities: extremities normal, atraumatic, no cyanosis or edema    Assessment:    Hypertension, normal blood pressure controlled. Evidence of target organ damage: none.    Plan:    Medication: no change. Dietary sodium restriction. Regular aerobic exercise. Check blood pressures 2-3 times weekly and record. Follow up: 6 months and as needed.

## 2014-05-23 NOTE — Patient Instructions (Signed)
Hypertension Hypertension, commonly called high blood pressure, is when the force of blood pumping through your arteries is too strong. Your arteries are the blood vessels that carry blood from your heart throughout your body. A blood pressure reading consists of a higher number over a lower number, such as 110/72. The higher number (systolic) is the pressure inside your arteries when your heart pumps. The lower number (diastolic) is the pressure inside your arteries when your heart relaxes. Ideally you want your blood pressure below 120/80. Hypertension forces your heart to work harder to pump blood. Your arteries may become narrow or stiff. Having hypertension puts you at risk for heart disease, stroke, and other problems.  RISK FACTORS Some risk factors for high blood pressure are controllable. Others are not.  Risk factors you cannot control include:   Race. You may be at higher risk if you are African American.  Age. Risk increases with age.  Gender. Men are at higher risk than women before age 45 years. After age 65, women are at higher risk than men. Risk factors you can control include:  Not getting enough exercise or physical activity.  Being overweight.  Getting too much fat, sugar, calories, or salt in your diet.  Drinking too much alcohol. SIGNS AND SYMPTOMS Hypertension does not usually cause signs or symptoms. Extremely high blood pressure (hypertensive crisis) may cause headache, anxiety, shortness of breath, and nosebleed. DIAGNOSIS  To check if you have hypertension, your health care Carolyn Hebert will measure your blood pressure while you are seated, with your arm held at the level of your heart. It should be measured at least twice using the same arm. Certain conditions can cause a difference in blood pressure between your right and left arms. A blood pressure reading that is higher than normal on one occasion does not mean that you need treatment. If one blood pressure reading  is high, ask your health care Carolyn Hebert about having it checked again. TREATMENT  Treating high blood pressure includes making lifestyle changes and possibly taking medication. Living a healthy lifestyle can help lower high blood pressure. You may need to change some of your habits. Lifestyle changes may include:  Following the DASH diet. This diet is high in fruits, vegetables, and whole grains. It is low in salt, red meat, and added sugars.  Getting at least 2 1/2 hours of brisk physical activity every week.  Losing weight if necessary.  Not smoking.  Limiting alcoholic beverages.  Learning ways to reduce stress. If lifestyle changes are not enough to get your blood pressure under control, your health care Carolyn Hebert may prescribe medicine. You may need to take more than one. Work closely with your health care Carolyn Hebert to understand the risks and benefits. HOME CARE INSTRUCTIONS  Have your blood pressure rechecked as directed by your health care Carolyn Hebert.   Only take medicine as directed by your health care Carolyn Hebert. Follow the directions carefully. Blood pressure medicines must be taken as prescribed. The medicine does not work as well when you skip doses. Skipping doses also puts you at risk for problems.   Do not smoke.   Monitor your blood pressure at home as directed by your health care Carolyn Hebert. SEEK MEDICAL CARE IF:   You think you are having a reaction to medicines taken.  You have recurrent headaches or feel dizzy.  You have swelling in your ankles.  You have trouble with your vision. SEEK IMMEDIATE MEDICAL CARE IF:  You develop a severe headache or   confusion.  You have unusual weakness, numbness, or feel faint.  You have severe chest or abdominal pain.  You vomit repeatedly.  You have trouble breathing. MAKE SURE YOU:   Understand these instructions.  Will watch your condition.  Will get help right away if you are not doing well or get  worse. Document Released: 11/14/2005 Document Revised: 11/19/2013 Document Reviewed: 09/06/2013 Exit[employer] Patient Information 2015 [employer]. This information is not intended to replace advice given to you by your health care Carolyn Hebert. Make sure you discuss any questions you have with your health care Carolyn Hebert.  

## 2014-05-23 NOTE — Progress Notes (Signed)
Pre visit review using our clinic review tool, if applicable. No additional management support is needed unless otherwise documented below in the visit note. 

## 2014-05-26 ENCOUNTER — Telehealth: Payer: Self-pay | Admitting: Family Medicine

## 2014-05-26 NOTE — Telephone Encounter (Signed)
Relevant patient education assigned to patient using Emmi. ° °

## 2014-05-29 ENCOUNTER — Other Ambulatory Visit: Payer: Self-pay | Admitting: *Deleted

## 2014-05-29 ENCOUNTER — Other Ambulatory Visit: Payer: Self-pay

## 2014-05-29 DIAGNOSIS — J302 Other seasonal allergic rhinitis: Secondary | ICD-10-CM

## 2014-05-29 DIAGNOSIS — F329 Major depressive disorder, single episode, unspecified: Secondary | ICD-10-CM

## 2014-05-29 DIAGNOSIS — F32A Depression, unspecified: Secondary | ICD-10-CM

## 2014-05-29 MED ORDER — MONTELUKAST SODIUM 10 MG PO TABS: 10.0000 mg | ORAL_TABLET | Freq: Every day | ORAL | Status: DC

## 2014-05-29 MED ORDER — FLUTICASONE PROPIONATE 50 MCG/ACT NA SUSP: NASAL | Status: DC

## 2014-05-29 MED ORDER — ESCITALOPRAM OXALATE 20 MG PO TABS: ORAL_TABLET | ORAL | Status: DC

## 2014-07-21 ENCOUNTER — Encounter: Payer: Self-pay | Admitting: Family Medicine

## 2014-07-21 MED ORDER — LOSARTAN POTASSIUM-HCTZ 100-25 MG PO TABS: 1.0000 | ORAL_TABLET | Freq: Every day | ORAL | Status: DC

## 2014-08-09 ENCOUNTER — Other Ambulatory Visit: Payer: Self-pay | Admitting: Family Medicine

## 2014-09-12 ENCOUNTER — Other Ambulatory Visit: Payer: Self-pay

## 2014-09-29 ENCOUNTER — Encounter: Payer: Self-pay | Admitting: Family Medicine

## 2014-10-08 ENCOUNTER — Other Ambulatory Visit: Payer: Self-pay | Admitting: Family Medicine

## 2014-10-08 ENCOUNTER — Other Ambulatory Visit: Payer: Self-pay | Admitting: Pulmonary Disease

## 2014-10-27 ENCOUNTER — Ambulatory Visit (INDEPENDENT_AMBULATORY_CARE_PROVIDER_SITE_OTHER): Payer: 59 | Admitting: Medical

## 2014-10-27 ENCOUNTER — Encounter: Payer: Self-pay | Admitting: Medical

## 2014-10-27 VITALS — BP 129/86 | HR 73 | Temp 98.4°F | Ht 66.75 in | Wt 182.6 lb

## 2014-10-27 DIAGNOSIS — J0100 Acute maxillary sinusitis, unspecified: Secondary | ICD-10-CM | POA: Insufficient documentation

## 2014-10-27 MED ORDER — CLARITHROMYCIN ER 500 MG PO TB24: 1000.0000 mg | ORAL_TABLET | Freq: Every day | ORAL | Status: DC

## 2014-10-27 MED ORDER — FLUTICASONE PROPIONATE 50 MCG/ACT NA SUSP: 2.0000 | Freq: Every day | NASAL | Status: DC

## 2014-10-27 MED ORDER — BENZONATATE 100 MG PO CAPS: 100.0000 mg | ORAL_CAPSULE | Freq: Three times a day (TID) | ORAL | Status: DC | PRN

## 2014-10-27 NOTE — Assessment & Plan Note (Signed)
sinus infection. I am prescribing antibiotic for the infection. To help with the nasal congestion I prescribed nasal steroid. For your associated cough, I prescribed cough medicine

## 2014-10-27 NOTE — Progress Notes (Signed)
Pre visit review using our clinic review tool, if applicable. No additional management support is needed unless otherwise documented below in the visit note. 

## 2014-10-27 NOTE — Patient Instructions (Addendum)
Your appear to have a sinus infection. I am prescribing antibiotic for the infection. To help with the nasal congestion I prescribed nasal steroid. For your associated cough, I prescribed cough medicine.  Rest, hydrate, tylenol for fever.  Follow up in 7 days or as needed.  Also, I want you to check your bp daily on reduced dose of current bp medication. Then call us in 2 wks with those readings.If your bp readings are controlled then will prescribe you the lower dose.

## 2014-10-27 NOTE — Progress Notes (Signed)
Subjective:    Patient ID: Charlies Constable, female    DOB: 04/17/1976, 38 y.o.   MRN: [id number]  HPI  Pt in today reporting cough, nasal congestion(with sinus pressure)and runny nose for 5  Days. Last couple of days worse. Pt states hx of sinus infection 3-4 times a year. Associated symptoms( below yes or no)  Fever-subjective this am. Chills- this am. Chest congestion-faint mild. But hx of bronchitis if sinus  Infections get bad. Sneezing- no Itching eyes-no Sore throat- faint mild Post-nasal drainage-yes Wheezing-no Purulent drainage-yes. Fatigue-yes Nonsmoker.  LMP- now/presently  Past Medical History  Diagnosis Date  . Ovarian cyst   . LGSIL (low grade squamous intraepithelial lesion) on Pap smear   . Anxiety   . Depression   . IBS (irritable bowel syndrome)   . Dysplastic nevus   . HSV-2 infection   . Abnormal Pap smear     History   Social History  . Marital Status: Married    Spouse Name: N/A    Number of Children: 1  . Years of Education: N/A   Occupational History  . sales    Social History Main Topics  . Smoking status: Former Smoker -- 7 years    Types: Cigarettes    Quit date: 11/29/1999  . Smokeless tobacco: Never Used     Comment: "couple of cigs on weekend"  . Alcohol Use: Yes     Comment: 2-3 beers per week  . Drug Use: No  . Sexual Activity: Yes    Birth Control/ Protection: Pill   Other Topics Concern  . Not on file   Social History Narrative    Past Surgical History  Procedure Laterality Date  . Cystectomy    . Cesarean section    . Laparoscopy    . Wisdom tooth extraction    . Septoplasty with ethmoidectomy, and maxillary antrostomy  2011     [place] , [place]    Family History  Problem Relation Age of Onset  . Cancer Maternal Aunt   . Cancer Maternal Grandfather     prostate/lung  . Hypertension Father   . Hyperlipidemia Mother   . Cancer Maternal Uncle     melanoma    Allergies  Allergen Reactions  .  Penicillins     hives    Current Outpatient Prescriptions on File Prior to Visit  Medication Sig Dispense Refill  . albuterol (PROVENTIL HFA;VENTOLIN HFA) 108 (90 BASE) MCG/ACT inhaler Inhale 2 puffs into the lungs 4 (four) times daily. 1 Inhaler 2  . budesonide-formoterol (SYMBICORT) 160-4.5 MCG/ACT inhaler Inhale 2 puffs into the lungs 2 (two) times daily. 3 Inhaler 3  . escitalopram (LEXAPRO) 20 MG tablet TAKE 1 TABLET (20 MG TOTAL) BY MOUTH DAILY. 30 tablet 5  . fexofenadine (ALLEGRA) 180 MG tablet Take 1 tablet (180 mg total) by mouth daily. 90 tablet 1  . fluticasone (FLONASE) 50 MCG/ACT nasal spray PLACE 1 SPRAY INTO THE NOSE 2 (TWO) TIMES DAILY AS NEEDED FOR RHINITIS. 16 g 3  . losartan-hydrochlorothiazide (HYZAAR) 100-25 MG per tablet Take 1 tablet by mouth daily. 90 tablet 1  . montelukast (SINGULAIR) 10 MG tablet Take 1 tablet (10 mg total) by mouth at bedtime. 90 tablet 1  . Olopatadine HCl (PATADAY) 0.2 % SOLN Apply to eye.    . SYMBICORT 160-4.5 MCG/ACT inhaler INHALE 2 PUFFS INTO THE LUNGS 2 (TWO) TIMES DAILY. 10.2 g 4  . thyroid (NATURE-THROID) 32.5 MG tablet Take 32.5 mg by mouth daily.  No current facility-administered medications on file prior to visit.    BP 129/86 mmHg  Pulse 73  Temp(Src) 98.4 F (36.9 C) (Oral)  Ht 5' 6.75" (1.695 m)  Wt 182 lb 9.6 oz (82.827 kg)  BMI 28.83 kg/m2  SpO2 99%  LMP 10/27/2014       Review of Systems  Constitutional: Negative for fever, chills and fatigue.  HENT: Negative for congestion, ear pain, mouth sores, nosebleeds, postnasal drip, rhinorrhea, sinus pressure, sore throat, tinnitus and trouble swallowing.   Respiratory: Negative for cough, chest tightness, shortness of breath and wheezing.   Cardiovascular: Negative for chest pain and palpitations.  Neurological: Negative for headaches.  Hematological: Negative for adenopathy.       Objective:   Physical Exam   General  Mental Status - Alert. General  Appearance - Well groomed. Not in acute distress.  Skin Rashes- No Rashes.  HEENT Head- Normal. Ear Auditory Canal - Left- Normal. Right - Normal.Tympanic Membrane- Left- Normal. Right- Normal. Eye Sclera/Conjunctiva- Left- Normal. Right- Normal. Nose & Sinuses Nasal Mucosa- Left-  Boggy and Congested. Right-  Boggy and  Congested.Bilateral maxillary and frontal sinus pressure. Mouth & Throat Lips: Upper Lip- Normal: no dryness, cracking, pallor, cyanosis, or vesicular eruption. Lower Lip-Normal: no dryness, cracking, pallor, cyanosis or vesicular eruption. Buccal Mucosa- Bilateral- No Aphthous ulcers. Oropharynx- No Discharge or Erythema. Tonsils: Characteristics- Bilateral- No Erythema or Congestion. Size/Enlargement- Bilateral- No enlargement. Discharge- bilateral-None.  Neck Neck- Supple. No Masses.   Chest and Lung Exam Auscultation: Breath Sounds:-Clear even and unlabored.  Cardiovascular Auscultation:Rythm- Regular, rate and rhythm. Murmurs & Other Heart Sounds:Ausculatation of the heart reveal- No Murmurs.  Lymphatic Head & Neck General Head & Neck Lymphatics: Bilateral: Description- No Localized lymphadenopathy.  General  Mental Status - Alert. General Appearance - Well groomed. Not in acute distress.  Skin Rashes- No Rashes.  HEENT Head- Normal. Ear Auditory Canal - Left- Normal. Right - Normal.Tympanic Membrane- Left- Normal. Right- Normal. Eye Sclera/Conjunctiva- Left- Normal. Right- Normal. Nose & Sinuses Nasal Mucosa- Left-  Boggy and Congested. Right-  Boggy and  Congested.Bilateral maxillary and frontal sinus pressure. Mouth & Throat Lips: Upper Lip- Normal: no dryness, cracking, pallor, cyanosis, or vesicular eruption. Lower Lip-Normal: no dryness, cracking, pallor, cyanosis or vesicular eruption. Buccal Mucosa- Bilateral- No Aphthous ulcers. Oropharynx- No Discharge or Erythema. Tonsils: Characteristics- Bilateral- No Erythema or Congestion.  Size/Enlargement- Bilateral- No enlargement. Discharge- bilateral-None.  Neck Neck- Supple. No Masses.   Chest and Lung Exam Auscultation: Breath Sounds:-Clear even and unlabored.  Cardiovascular Auscultation:Rythm- Regular, rate and rhythm. Murmurs & Other Heart Sounds:Ausculatation of the heart reveal- No Murmurs.  Lymphatic Head & Neck General Head & Neck Lymphatics: Bilateral: Description- No Localized lymphadenopathy.         Assessment & Plan:  Pt states she was getting mild dizzy in past and [facility] advised her to take half of her losartan. Since then pt is better. I advised her to check her bp daily. Pt has not been checking her bp.

## 2014-11-12 ENCOUNTER — Ambulatory Visit (INDEPENDENT_AMBULATORY_CARE_PROVIDER_SITE_OTHER): Payer: 59 | Admitting: Physician Assistant

## 2014-11-12 ENCOUNTER — Encounter: Payer: Self-pay | Admitting: Physician Assistant

## 2014-11-12 VITALS — BP 128/90 | HR 87 | Temp 99.2°F | Resp 16 | Ht 67.0 in | Wt 182.0 lb

## 2014-11-12 DIAGNOSIS — R509 Fever, unspecified: Secondary | ICD-10-CM

## 2014-11-12 DIAGNOSIS — J00 Acute nasopharyngitis [common cold]: Secondary | ICD-10-CM

## 2014-11-12 DIAGNOSIS — J208 Acute bronchitis due to other specified organisms: Principal | ICD-10-CM

## 2014-11-12 DIAGNOSIS — B9689 Other specified bacterial agents as the cause of diseases classified elsewhere: Secondary | ICD-10-CM | POA: Insufficient documentation

## 2014-11-12 DIAGNOSIS — R52 Pain, unspecified: Secondary | ICD-10-CM

## 2014-11-12 MED ORDER — DOXYCYCLINE HYCLATE 100 MG PO CAPS: 100.0000 mg | ORAL_CAPSULE | Freq: Two times a day (BID) | ORAL | Status: DC

## 2014-11-12 NOTE — Progress Notes (Signed)
Pre visit review using our clinic review tool, if applicable. No additional management support is needed unless otherwise documented below in the visit note/SLS  

## 2014-11-12 NOTE — Progress Notes (Signed)
Patient presents to clinic today c/o sinus pressure, sinus pain, facial pain, chest congestion and worsening productive cough x 5 days.  Endorses intermittent fevers.  Has history of asthma and uses Albuterol inhaler PRN.  Is also taking Claritin, Flonase and Singulair as directed.  Denies SOB or wheeze.Marland Kitchen  Has had flu shot.  Denies recent travel or sick contact.  Symptoms are rapidly worsening.  Past Medical History  Diagnosis Date  . Ovarian cyst   . LGSIL (low grade squamous intraepithelial lesion) on Pap smear   . Anxiety   . Depression   . IBS (irritable bowel syndrome)   . Dysplastic nevus   . HSV-2 infection   . Abnormal Pap smear     Current Outpatient Prescriptions on File Prior to Visit  Medication Sig Dispense Refill  . albuterol (PROVENTIL HFA;VENTOLIN HFA) 108 (90 BASE) MCG/ACT inhaler Inhale 2 puffs into the lungs 4 (four) times daily. 1 Inhaler 2  . benzonatate (TESSALON) 100 MG capsule Take 1 capsule (100 mg total) by mouth 3 (three) times daily as needed for cough. 21 capsule 0  . budesonide-formoterol (SYMBICORT) 160-4.5 MCG/ACT inhaler Inhale 2 puffs into the lungs 2 (two) times daily. 3 Inhaler 3  . escitalopram (LEXAPRO) 20 MG tablet TAKE 1 TABLET (20 MG TOTAL) BY MOUTH DAILY. 30 tablet 5  . fexofenadine (ALLEGRA) 180 MG tablet Take 1 tablet (180 mg total) by mouth daily. 90 tablet 1  . fluticasone (FLONASE) 50 MCG/ACT nasal spray PLACE 1 SPRAY INTO THE NOSE 2 (TWO) TIMES DAILY AS NEEDED FOR RHINITIS. 16 g 3  . hydrocortisone (CORTEF) 5 MG tablet Take 5 mg by mouth 2 (two) times daily.    Marland Kitchen losartan-hydrochlorothiazide (HYZAAR) 100-25 MG per tablet Take 1 tablet by mouth daily. 90 tablet 1  . montelukast (SINGULAIR) 10 MG tablet Take 1 tablet (10 mg total) by mouth at bedtime. 90 tablet 1  . Olopatadine HCl (PATADAY) 0.2 % SOLN Apply to eye.    . thyroid (NATURE-THROID) 32.5 MG tablet Take 32.5 mg by mouth daily.     No current facility-administered medications on  file prior to visit.    Allergies  Allergen Reactions  . Penicillins     hives    Family History  Problem Relation Age of Onset  . Cancer Maternal Aunt   . Cancer Maternal Grandfather     prostate/lung  . Hypertension Father   . Hyperlipidemia Mother   . Cancer Maternal Uncle     melanoma    History   Social History  . Marital Status: Married    Spouse Name: N/A    Number of Children: 1  . Years of Education: N/A   Occupational History  . sales    Social History Main Topics  . Smoking status: Former Smoker -- 7 years    Types: Cigarettes    Quit date: 11/29/1999  . Smokeless tobacco: Never Used     Comment: "couple of cigs on weekend"  . Alcohol Use: Yes     Comment: 2-3 beers per week  . Drug Use: No  . Sexual Activity: Yes    Birth Control/ Protection: Pill   Other Topics Concern  . None   Social History Narrative   Review of Systems - See HPI.  All other ROS are negative.  BP 128/90 mmHg  Pulse 87  Temp(Src) 99.2 F (37.3 C) (Oral)  Resp 16  Ht 5\' 7"  (1.702 m)  Wt 182 lb (82.555 kg)  BMI  28.50 kg/m2  SpO2 99%  LMP 10/27/2014  Physical Exam  Constitutional: She is oriented to person, place, and time and well-developed, well-nourished, and in no distress.  HENT:  Head: Normocephalic and atraumatic.  Right Ear: External ear normal.  Left Ear: External ear normal.  Nose: Nose normal.  Mouth/Throat: Oropharynx is clear and moist. No oropharyngeal exudate.  TM within normal limits bilaterally.  Eyes: Conjunctivae are normal.  Neck: Neck supple.  Cardiovascular: Normal rate, regular rhythm, normal heart sounds and intact distal pulses.   Pulmonary/Chest: Effort normal and breath sounds normal. No respiratory distress. She has no wheezes. She has no rales. She exhibits no tenderness.  Neurological: She is alert and oriented to person, place, and time.  Skin: Skin is warm and dry. No rash noted.  Psychiatric: Affect normal.  Vitals  reviewed.   No results found for this or any previous visit (from the past 2160 hour(s)).  Assessment/Plan: Acute bacterial bronchitis Rx Doxycycline.  Increase fluid.  Rest.  Probiotic. Mucinex DM.  Continue allergy and asthma medications as directed.  Return precautions discussed with patient.

## 2014-11-12 NOTE — Assessment & Plan Note (Addendum)
Rx Doxycycline.  Increase fluid.  Rest.  Probiotic. Mucinex DM.  Continue allergy and asthma medications as directed.  Return precautions discussed with patient.

## 2014-11-12 NOTE — Patient Instructions (Signed)
Please take antibiotic as directed.  Increase fluids.  Rest.  Mucinex-D as directed.  Continue asthma and allergy medications. Place a humidifier in the bedroom. Call or return to clinic if symptoms are not improving.  Acute Bronchitis Bronchitis is inflammation of the airways that extend from the windpipe into the lungs (bronchi). The inflammation often causes mucus to develop. This leads to a cough, which is the most common symptom of bronchitis.  In acute bronchitis, the condition usually develops suddenly and goes away over time, usually in a couple weeks. Smoking, allergies, and asthma can make bronchitis worse. Repeated episodes of bronchitis may cause further lung problems.  CAUSES Acute bronchitis is most often caused by the same virus that causes a cold. The virus can spread from person to person (contagious) through coughing, sneezing, and touching contaminated objects. SIGNS AND SYMPTOMS   Cough.   Fever.   Coughing up mucus.   Body aches.   Chest congestion.   Chills.   Shortness of breath.   Sore throat.  DIAGNOSIS  Acute bronchitis is usually diagnosed through a physical exam. Your health care provider will also ask you questions about your medical history. Tests, such as chest X-rays, are sometimes done to rule out other conditions.  TREATMENT  Acute bronchitis usually goes away in a couple weeks. Oftentimes, no medical treatment is necessary. Medicines are sometimes given for relief of fever or cough. Antibiotic medicines are usually not needed but may be prescribed in certain situations. In some cases, an inhaler may be recommended to help reduce shortness of breath and control the cough. A cool mist vaporizer may also be used to help thin bronchial secretions and make it easier to clear the chest.  HOME CARE INSTRUCTIONS  Get plenty of rest.   Drink enough fluids to keep your urine clear or pale yellow (unless you have a medical condition that requires fluid  restriction). Increasing fluids may help thin your respiratory secretions (sputum) and reduce chest congestion, and it will prevent dehydration.   Take medicines only as directed by your health care provider.  If you were prescribed an antibiotic medicine, finish it all even if you start to feel better.  Avoid smoking and secondhand smoke. Exposure to cigarette smoke or irritating chemicals will make bronchitis worse. If you are a smoker, consider using nicotine gum or skin patches to help control withdrawal symptoms. Quitting smoking will help your lungs heal faster.   Reduce the chances of another bout of acute bronchitis by washing your hands frequently, avoiding people with cold symptoms, and trying not to touch your hands to your mouth, nose, or eyes.   Keep all follow-up visits as directed by your health care provider.  SEEK MEDICAL CARE IF: Your symptoms do not improve after 1 week of treatment.  SEEK IMMEDIATE MEDICAL CARE IF:  You develop an increased fever or chills.   You have chest pain.   You have severe shortness of breath.  You have bloody sputum.   You develop dehydration.  You faint or repeatedly feel like you are going to pass out.  You develop repeated vomiting.  You develop a severe headache. MAKE SURE YOU:   Understand these instructions.  Will watch your condition.  Will get help right away if you are not doing well or get worse. Document Released: 12/22/2004 Document Revised: 03/31/2014 Document Reviewed: 05/07/2013 [facility] Patient Information 2015 [place], [place]. This information is not intended to replace advice given to you by your health care provider.  Make sure you discuss any questions you have with your health care provider.

## 2014-11-17 LAB — POCT INFLUENZA A/B
Influenza A, POC: NEGATIVE
Influenza B, POC: NEGATIVE

## 2014-12-01 ENCOUNTER — Encounter: Payer: Self-pay | Admitting: Pulmonary Disease

## 2014-12-01 ENCOUNTER — Ambulatory Visit (INDEPENDENT_AMBULATORY_CARE_PROVIDER_SITE_OTHER): Payer: 59 | Admitting: Pulmonary Disease

## 2014-12-01 VITALS — BP 134/74 | HR 82 | Temp 98.2°F | Ht 67.0 in | Wt 186.8 lb

## 2014-12-01 DIAGNOSIS — J45901 Unspecified asthma with (acute) exacerbation: Secondary | ICD-10-CM | POA: Insufficient documentation

## 2014-12-01 DIAGNOSIS — J4541 Moderate persistent asthma with (acute) exacerbation: Secondary | ICD-10-CM

## 2014-12-01 MED ORDER — METHYLPREDNISOLONE 8 MG PO TABS: ORAL_TABLET | ORAL | Status: DC

## 2014-12-01 NOTE — Assessment & Plan Note (Signed)
The patient has had a recent episode of what sounds like acute bronchitis, and responded well to a course of doxycycline. Her mucus production is very little currently, but she has had ongoing chest tightness, increased cough, and shortness of breath. I suspect that she has increased airway inflammation related to her asthma, and would benefit from a short course of steroids. She tells me that she does poorly with prednisone, and usually does better with a lower dose in a more prolonged taper.

## 2014-12-01 NOTE — Patient Instructions (Signed)
Will treat with a course of steroids.  Will use medrol taper over 2 weeks since you do better with a lower dose over a longer period of time. No change in usual asthma meds Will refill tessalon pearls. Keep followup apptm with Dr. Elsworth Soho.

## 2014-12-01 NOTE — Progress Notes (Signed)
   Subjective:    Patient ID: Carolyn Hebert, female    DOB: 07-Apr-1976, 39 y.o.   MRN: [id number]  HPI The patient comes in today for an acute sick visit. She has known asthma, and recently had what sounds like acute bronchitis. She was treated with antibiotic by her primary physician, and did see improvement in her cough and mucus production. However, she has had persistent chest discomfort, increased resistance to air flow, and increased shortness of breath. She was not treated with a course of prednisone at the time of her antibiotics.   Review of Systems  Constitutional: Negative for fever and unexpected weight change.  HENT: Positive for congestion. Negative for dental problem, ear pain, nosebleeds, postnasal drip, rhinorrhea, sinus pressure, sneezing, sore throat and trouble swallowing.   Eyes: Negative for redness and itching.  Respiratory: Positive for cough. Negative for chest tightness, shortness of breath and wheezing.   Cardiovascular: Negative for palpitations and leg swelling.  Gastrointestinal: Negative for nausea and vomiting.  Genitourinary: Negative for dysuria.  Musculoskeletal: Negative for joint swelling.  Skin: Negative for rash.  Neurological: Positive for headaches.  Hematological: Does not bruise/bleed easily.  Psychiatric/Behavioral: Negative for dysphoric mood. The patient is not nervous/anxious.        Objective:   Physical Exam Overweight female in no acute distress Nose without purulence or discharge noted Neck without lymphadenopathy or thyromegaly Chest with a few rhonchi, no active wheezing, adequate airflow Cardiac exam with regular rate and rhythm Lower extremities without edema, no cyanosis Alert and oriented, moves all 4 extremities.       Assessment & Plan:

## 2014-12-02 ENCOUNTER — Telehealth: Payer: Self-pay | Admitting: Pulmonary Disease

## 2014-12-02 MED ORDER — BENZONATATE 100 MG PO CAPS: 100.0000 mg | ORAL_CAPSULE | Freq: Three times a day (TID) | ORAL | Status: DC | PRN

## 2014-12-02 NOTE — Telephone Encounter (Signed)
lmtcb for pt.   [facility] per last OV on 12/01/14:  Patient Instructions     Will treat with a course of steroids. Will use medrol taper over 2 weeks since you do better with a lower dose over a longer period of time. No change in usual asthma meds Will refill tessalon pearls. Keep followup apptm with Dr. Elsworth Soho.    Last tessalon 100mg  rx was for #21 with 0 refills, TID prn.  OK to fill as previously scripted?

## 2014-12-02 NOTE — Telephone Encounter (Signed)
Pt called back and aware I have sent Rx as previous and nothing more needed at this time.

## 2014-12-03 ENCOUNTER — Telehealth: Payer: Self-pay | Admitting: Pulmonary Disease

## 2014-12-03 MED ORDER — LEVOFLOXACIN 750 MG PO TABS: 750.0000 mg | ORAL_TABLET | Freq: Every day | ORAL | Status: DC

## 2014-12-03 NOTE — Telephone Encounter (Signed)
Patient Instructions     Will treat with a course of steroids. Will use medrol taper over 2 weeks since you do better with a lower dose over a longer period of time. No change in usual asthma meds Will refill tessalon pearls. Keep followup apptm with Dr. Elsworth Soho.

## 2014-12-03 NOTE — Telephone Encounter (Signed)
Called and spoke to pt. Informed pt of the recs per [facility]. Rx sent to preferred pharmacy. Pt verbalized understanding and denied any further questions or concerns at this time.

## 2014-12-03 NOTE — Telephone Encounter (Signed)
She was treated with doxy in mid dec when she got sick.  Would like for her to finish prednisone, and will give her more broad spectrum abx.  levaquin 750mg  one a day for 5 days.

## 2014-12-03 NOTE — Telephone Encounter (Signed)
Called and spoke to pt. Pt stated she has started to feel worse since OV with [place] on 12/01/2014. Pt c/o increase in SOB, prod cough with green mucus, increase in fatigue and increase in chest tightness. Pt stated she has had chills this morning but has not taken temperature. Pt started taking pred on 12/02/2014. Dr. Elsworth Soho is unavailable today.   Dr. Gwenette Greet, please advise.   Allergies  Allergen Reactions  . Penicillins     hives    Current Outpatient Prescriptions on File Prior to Visit  Medication Sig Dispense Refill  . albuterol (PROVENTIL HFA;VENTOLIN HFA) 108 (90 BASE) MCG/ACT inhaler Inhale 2 puffs into the lungs 4 (four) times daily. 1 Inhaler 2  . benzonatate (TESSALON) 100 MG capsule Take 1 capsule (100 mg total) by mouth 3 (three) times daily as needed for cough. 21 capsule 0  . budesonide-formoterol (SYMBICORT) 160-4.5 MCG/ACT inhaler Inhale 2 puffs into the lungs 2 (two) times daily. 3 Inhaler 3  . escitalopram (LEXAPRO) 20 MG tablet TAKE 1 TABLET (20 MG TOTAL) BY MOUTH DAILY. 30 tablet 5  . fexofenadine (ALLEGRA) 180 MG tablet Take 1 tablet (180 mg total) by mouth daily. 90 tablet 1  . fluticasone (FLONASE) 50 MCG/ACT nasal spray PLACE 1 SPRAY INTO THE NOSE 2 (TWO) TIMES DAILY AS NEEDED FOR RHINITIS. 16 g 3  . hydrocortisone (CORTEF) 5 MG tablet Take 5 mg by mouth 2 (two) times daily.    Marland Kitchen losartan-hydrochlorothiazide (HYZAAR) 100-25 MG per tablet Take 1 tablet by mouth daily. 90 tablet 1  . methylPREDNISolone (MEDROL) 8 MG tablet Take 3 daily x 3 days, 2 daily x 3 days, 1 daily x 3 days, 1/2 tab daily x 3 days 20 tablet 0  . montelukast (SINGULAIR) 10 MG tablet Take 1 tablet (10 mg total) by mouth at bedtime. 90 tablet 1  . Olopatadine HCl (PATADAY) 0.2 % SOLN Apply to eye.    . thyroid (NATURE-THROID) 32.5 MG tablet Take 32.5 mg by mouth daily.     No current facility-administered medications on file prior to visit.

## 2014-12-08 ENCOUNTER — Other Ambulatory Visit: Payer: Self-pay | Admitting: Pulmonary Disease

## 2014-12-08 ENCOUNTER — Other Ambulatory Visit: Payer: Self-pay | Admitting: Family Medicine

## 2014-12-09 ENCOUNTER — Other Ambulatory Visit: Payer: Self-pay | Admitting: Family Medicine

## 2014-12-09 ENCOUNTER — Other Ambulatory Visit: Payer: Self-pay

## 2014-12-09 MED ORDER — LOSARTAN POTASSIUM-HCTZ 100-25 MG PO TABS: 1.0000 | ORAL_TABLET | Freq: Every day | ORAL | Status: DC

## 2014-12-11 ENCOUNTER — Other Ambulatory Visit: Payer: Self-pay | Admitting: Pulmonary Disease

## 2015-02-11 ENCOUNTER — Other Ambulatory Visit: Payer: Self-pay | Admitting: Pulmonary Disease

## 2015-02-24 ENCOUNTER — Ambulatory Visit (INDEPENDENT_AMBULATORY_CARE_PROVIDER_SITE_OTHER): Payer: 59 | Admitting: Family Medicine

## 2015-02-24 ENCOUNTER — Encounter: Payer: Self-pay | Admitting: Family Medicine

## 2015-02-24 VITALS — BP 126/84 | HR 75 | Temp 98.8°F | Wt 191.0 lb

## 2015-02-24 DIAGNOSIS — I10 Essential (primary) hypertension: Secondary | ICD-10-CM

## 2015-02-24 DIAGNOSIS — J0110 Acute frontal sinusitis, unspecified: Secondary | ICD-10-CM

## 2015-02-24 MED ORDER — CLARITHROMYCIN ER 500 MG PO TB24: 1000.0000 mg | ORAL_TABLET | Freq: Every day | ORAL | Status: DC

## 2015-02-24 MED ORDER — FLUTICASONE PROPIONATE 50 MCG/ACT NA SUSP: 2.0000 | Freq: Every day | NASAL | Status: DC

## 2015-02-24 MED ORDER — LOSARTAN POTASSIUM-HCTZ 50-12.5 MG PO TABS: 1.0000 | ORAL_TABLET | Freq: Every day | ORAL | Status: DC

## 2015-02-24 NOTE — Progress Notes (Signed)
Pre visit review using our clinic review tool, if applicable. No additional management support is needed unless otherwise documented below in the visit note. 

## 2015-02-24 NOTE — Patient Instructions (Signed)

## 2015-02-24 NOTE — Progress Notes (Signed)
  Subjective:     Carolyn Hebert is a 39 y.o. female who presents for evaluation of sinus pain. Symptoms include: congestion, facial pain, foul breath, foul rhinorrhea, headaches, nasal congestion, sinus pressure and sore throat. Onset of symptoms was 1 week ago. Symptoms have been gradually worsening since that time. Past history is significant for asthma. Patient is a non-smoker.  The following portions of the patient's history were reviewed and updated as appropriate: allergies, current medications, past family history, past medical history, past social history, past surgical history and problem list.  Review of Systems Pertinent items are noted in HPI.   Objective:    BP 126/84 mmHg  Pulse 75  Temp(Src) 98.8 F (37.1 C) (Oral)  Wt 191 lb (86.637 kg)  SpO2 96%  LMP 02/10/2015 General appearance: alert, cooperative, appears stated age and no distress Ears: normal TM's and external ear canals both ears Nose: green and yellow discharge, moderate congestion, turbinates red, swollen, sinus tenderness bilateral Throat: abnormal findings: marked oropharyngeal erythema-- pnd Neck: mild anterior cervical adenopathy, supple, symmetrical, trachea midline and thyroid not enlarged, symmetric, no tenderness/mass/nodules Lungs: clear to auscultation bilaterally Heart: S1, S2 normal    Assessment:    Acute bacterial sinusitis.    Plan:    Nasal steroids per medication orders. Antihistamines per medication orders. Biaxin per medication orders. f/u prn

## 2015-04-20 ENCOUNTER — Other Ambulatory Visit: Payer: Self-pay | Admitting: Family Medicine

## 2015-05-05 ENCOUNTER — Other Ambulatory Visit: Payer: Self-pay | Admitting: Pulmonary Disease

## 2015-05-05 ENCOUNTER — Other Ambulatory Visit: Payer: Self-pay | Admitting: Family Medicine

## 2015-05-25 ENCOUNTER — Other Ambulatory Visit: Payer: Self-pay | Admitting: Pulmonary Disease

## 2015-05-25 MED ORDER — BUDESONIDE-FORMOTEROL FUMARATE 160-4.5 MCG/ACT IN AERO: 2.0000 | INHALATION_SPRAY | Freq: Two times a day (BID) | RESPIRATORY_TRACT | Status: DC

## 2015-05-25 NOTE — Telephone Encounter (Signed)
Rx for Symbicort refilled with note advising patient to schedule follow up appointment for additional refills. Nothing further needed.

## 2015-07-22 ENCOUNTER — Other Ambulatory Visit: Payer: Self-pay | Admitting: Pulmonary Disease

## 2015-08-14 ENCOUNTER — Telehealth: Payer: Self-pay | Admitting: Family Medicine

## 2015-08-14 ENCOUNTER — Other Ambulatory Visit: Payer: Self-pay | Admitting: Pulmonary Disease

## 2015-08-14 DIAGNOSIS — I10 Essential (primary) hypertension: Secondary | ICD-10-CM

## 2015-08-14 MED ORDER — LOSARTAN POTASSIUM-HCTZ 50-12.5 MG PO TABS: 1.0000 | ORAL_TABLET | Freq: Every day | ORAL | Status: DC

## 2015-08-14 NOTE — Telephone Encounter (Signed)
Rx faxed to [facility].     KP

## 2015-08-14 NOTE — Telephone Encounter (Signed)
Pharmacy: MUST USE [facility] for 3 month supply  Pt needing refills on losartan-hydrochlorothiazide (HYZAAR) 50-12.5 MG per tablet sent to above pharmacy as now required by insurance. She still has refills on previous RX at [facility] but insurance is requiring change and must be 90 day supply. Pt currently has 1 week on hand.

## 2015-09-09 ENCOUNTER — Ambulatory Visit: Payer: 59

## 2015-09-27 ENCOUNTER — Other Ambulatory Visit: Payer: Self-pay | Admitting: Pulmonary Disease

## 2015-09-29 ENCOUNTER — Encounter: Payer: Self-pay | Admitting: Family Medicine

## 2015-09-29 ENCOUNTER — Ambulatory Visit (INDEPENDENT_AMBULATORY_CARE_PROVIDER_SITE_OTHER): Payer: 59 | Admitting: Family Medicine

## 2015-09-29 VITALS — BP 124/78 | HR 84 | Temp 98.3°F | Wt 192.4 lb

## 2015-09-29 DIAGNOSIS — R300 Dysuria: Secondary | ICD-10-CM | POA: Diagnosis not present

## 2015-09-29 DIAGNOSIS — R350 Frequency of micturition: Secondary | ICD-10-CM

## 2015-09-29 LAB — POCT URINALYSIS DIPSTICK
Bilirubin, UA: NEGATIVE
Blood, UA: NEGATIVE
Glucose, UA: NEGATIVE
Ketones, UA: NEGATIVE
LEUKOCYTES UA: NEGATIVE
NITRITE UA: NEGATIVE
PROTEIN UA: NEGATIVE
Spec Grav, UA: 1.01
UROBILINOGEN UA: 2
pH, UA: 7.5

## 2015-09-29 MED ORDER — CIPROFLOXACIN HCL 250 MG PO TABS: 250.0000 mg | ORAL_TABLET | Freq: Two times a day (BID) | ORAL | Status: DC

## 2015-09-29 NOTE — Progress Notes (Signed)
Patient ID: Carolyn Hebert, female    DOB: December 28, 1975  Age: 39 y.o. MRN: [id number]    Subjective:  Subjective HPI Carolyn Hebert presents for c/o dysuria and frequency x 1 weeks.  No fever, no abd pain. Some R sided flank pain.    Review of Systems  Constitutional: Negative for fever, chills, activity change and appetite change.  Gastrointestinal: Negative for abdominal pain and abdominal distention.  Genitourinary: Positive for dysuria, urgency and frequency. Negative for hematuria, flank pain, vaginal discharge, difficulty urinating, genital sores, vaginal pain, menstrual problem, pelvic pain and dyspareunia.  Musculoskeletal: Negative for back pain.    History Past Medical History  Diagnosis Date  . Ovarian cyst   . LGSIL (low grade squamous intraepithelial lesion) on Pap smear   . Anxiety   . Depression   . IBS (irritable bowel syndrome)   . Dysplastic nevus   . HSV-2 infection   . Abnormal Pap smear     She has past surgical history that includes Cystectomy; Cesarean section; laparoscopy; Wisdom tooth extraction; and Septoplasty with ethmoidectomy, and maxillary antrostomy (2011).   Her family history includes Cancer in her maternal aunt, maternal grandfather, and maternal uncle; Deep vein thrombosis in her maternal grandmother; Hyperlipidemia in her mother; Hypertension in her father.She reports that she quit smoking about 15 years ago. Her smoking use included Cigarettes. She quit after 7 years of use. She has never used smokeless tobacco. She reports that she drinks alcohol. She reports that she does not use illicit drugs.  Current Outpatient Prescriptions on File Prior to Visit  Medication Sig Dispense Refill  . albuterol (PROVENTIL HFA;VENTOLIN HFA) 108 (90 BASE) MCG/ACT inhaler Inhale 2 puffs into the lungs 4 (four) times daily. 1 Inhaler 2  . escitalopram (LEXAPRO) 20 MG tablet Take 1 tablet by mouth  daily 90 tablet 1  . fluticasone (FLONASE) 50 MCG/ACT nasal spray  Place 2 sprays into both nostrils daily. 16 g 11  . losartan-hydrochlorothiazide (HYZAAR) 50-12.5 MG per tablet Take 1 tablet by mouth daily. 90 tablet 3  . montelukast (SINGULAIR) 10 MG tablet Take 1 tablet by mouth at  bedtime 30 tablet 0  . Olopatadine HCl (PATADAY) 0.2 % SOLN Apply to eye.    . SYMBICORT 160-4.5 MCG/ACT inhaler Use 2 puffs two times daily 30.6 g 4   No current facility-administered medications on file prior to visit.     Objective:  Objective Physical Exam  Abdominal: Soft. She exhibits no distension. There is tenderness in the suprapubic area. There is no rebound, no guarding and no CVA tenderness.  Genitourinary: Pelvic exam was performed with patient supine. There is no rash, tenderness, lesion or injury on the right labia. There is no rash, tenderness, lesion or injury on the left labia. No erythema or tenderness in the vagina. No vaginal discharge found.  Nursing note and vitals reviewed.  BP 124/78 mmHg  Pulse 84  Temp(Src) 98.3 F (36.8 C) (Oral)  Wt 192 lb 6.4 oz (87.272 kg)  SpO2 98% Wt Readings from Last 3 Encounters:  09/29/15 192 lb 6.4 oz (87.272 kg)  02/24/15 191 lb (86.637 kg)  12/01/14 186 lb 12.8 oz (84.732 kg)     Lab Results  Component Value Date   WBC 8.0 02/25/2014   HGB 13.9 02/25/2014   HCT 40.5 02/25/2014   PLT 269.0 02/25/2014   GLUCOSE 88 03/24/2014   CHOL 179 04/30/2013   TRIG 93.0 04/30/2013   HDL 75.40 04/30/2013   LDLCALC 85  04/30/2013   ALT 20 02/25/2014   AST 20 02/25/2014   NA 133* 03/24/2014   K 4.5 03/24/2014   CL 98 03/24/2014   CREATININE 0.6 03/24/2014   BUN 10 03/24/2014   CO2 25 03/24/2014   TSH 1.70 04/30/2013   MICROALBUR 0.2 04/30/2013    No results found.   Assessment & Plan:  Plan I have discontinued Carolyn Hebert fexofenadine, thyroid, and clarithromycin. I am also having her start on ciprofloxacin. Additionally, I am having her maintain her albuterol, Olopatadine HCl, fluticasone,  escitalopram, montelukast, SYMBICORT, losartan-hydrochlorothiazide, and NATURE-THROID.  Meds ordered this encounter  Medications  . NATURE-THROID 97.5 MG TABS    Sig: Take 1 tablet by mouth daily.  . ciprofloxacin (CIPRO) 250 MG tablet    Sig: Take 1 tablet (250 mg total) by mouth 2 (two) times daily.    Dispense:  6 tablet    Refill:  0    Problem List Items Addressed This Visit    Dysuria - Primary   Relevant Medications   ciprofloxacin (CIPRO) 250 MG tablet   Other Relevant Orders   POCT Urinalysis Dipstick (Completed)    Other Visit Diagnoses    Urine frequency        Relevant Orders    POCT Urinalysis Dipstick (Completed)       Follow-up: Return if symptoms worsen or fail to improve.  Garnet Koyanagi, DO      PPI:RJJOAC Etter Sjogren, DO Chief Complaint  Patient presents with  . Dysuria    and Frequency x's 1 week    Current Issues:  Presents with 7 days of dysuria, urinary urgency and urinary frequency Associated symptoms include:  flank pain on the left, urinary frequency and urinary urgency  There is no history of of similar symptoms. Sexually active:  Yes with female.   No concern for STI.  Prior to Admission medications   Medication Sig Start Date End Date Taking? Authorizing Provider  albuterol (PROVENTIL HFA;VENTOLIN HFA) 108 (90 BASE) MCG/ACT inhaler Inhale 2 puffs into the lungs 4 (four) times daily. 11/08/12  Yes Rosalita Chessman, DO  escitalopram (LEXAPRO) 20 MG tablet Take 1 tablet by mouth  daily 05/05/15  Yes Yvonne R Lowne, DO  fluticasone (FLONASE) 50 MCG/ACT nasal spray Place 2 sprays into both nostrils daily. 02/24/15  Yes Yvonne R Lowne, DO  losartan-hydrochlorothiazide (HYZAAR) 50-12.5 MG per tablet Take 1 tablet by mouth daily. 08/14/15  Yes Yvonne R Lowne, DO  montelukast (SINGULAIR) 10 MG tablet Take 1 tablet by mouth at  bedtime 07/22/15  Yes Rigoberto Noel, MD  NATURE-THROID 97.5 MG TABS Take 1 tablet by mouth daily. 09/03/15  Yes Historical Provider, MD    Olopatadine HCl (PATADAY) 0.2 % SOLN Apply to eye.   Yes Historical Provider, MD  SYMBICORT 160-4.5 MCG/ACT inhaler Use 2 puffs two times daily 08/14/15  Yes Rigoberto Noel, MD    Review of Systems:see above  PE:  BP 124/78 mmHg  Pulse 84  Temp(Src) 98.3 F (36.8 C) (Oral)  Wt 192 lb 6.4 oz (87.272 kg)  SpO2 98% Constitutional- no fever , chills Heart-- + s1+s2  Lungs-- ctab/l  Back-- no pain Abdomen---  Soft , nt   Results for orders placed or performed in visit on 09/29/15  POCT Urinalysis Dipstick  Result Value Ref Range   Color, UA Yellow    Clarity, UA Clear    Glucose, UA Neg    Bilirubin, UA Neg    Ketones, UA Neg  Spec Grav, UA 1.010    Blood, UA Neg    pH, UA 7.5    Protein, UA Neg    Urobilinogen, UA 2.0    Nitrite, UA Neg    Leukocytes, UA Negative Negative    Assessment and Plan:  1. Dysuria   - POCT Urinalysis Dipstick  2. Urine frequency   - POCT Urinalysis Dipstick

## 2015-09-29 NOTE — Progress Notes (Signed)
Pre visit review using our clinic review tool, if applicable. No additional management support is needed unless otherwise documented below in the visit note. 

## 2015-09-29 NOTE — Patient Instructions (Signed)
Urinary Frequency °The number of times a normal person urinates depends upon how much liquid they take in and how much liquid they are losing. If the temperature is hot and there is high humidity, then the person will sweat more and usually breathe a little more frequently. These factors decrease the amount of frequency of urination that would be considered normal. °The amount you drink is easily determined, but the amount of fluid lost is sometimes more difficult to calculate.  °Fluid is lost in two ways: °· Sensible fluid loss is usually measured by the amount of urine that you get rid of. Losses of fluid can also occur with diarrhea. °· Insensible fluid loss is more difficult to measure. It is caused by evaporation. Insensible loss of fluid occurs through breathing and sweating. It usually ranges from a little less than a quart to a little more than a quart of fluid a day. °In normal temperatures and activity levels, the average person may urinate 4 to 7 times in a 24-hour period. Needing to urinate more often than that could indicate a problem. If one urinates 4 to 7 times in 24 hours and has large volumes each time, that could indicate a different problem from one who urinates 4 to 7 times a day and has small volumes. The time of urinating is also important. Most urinating should be done during the waking hours. Getting up at night to urinate frequently can indicate some problems. °CAUSES  °The bladder is the organ in your lower abdomen that holds urine. Like a balloon, it swells some as it fills up. Your nerves sense this and tell you it is time to head for the bathroom. There are a number of reasons that you might feel the need to urinate more often than usual. They include: °· Urinary tract infection. This is usually associated with other signs such as burning when you urinate. °· In men, problems with the prostate (a walnut-size gland that is located near the tube that carries urine out of your body). There  are two reasons why the prostate can cause an increased frequency of urination: °¨ An enlarged prostate that does not let the bladder empty well. If the bladder only half empties when you urinate, then it only has half the capacity to fill before you have to urinate again. °¨ The nerves in the bladder become more hypersensitive with an increased size of the prostate even if the bladder empties completely. °· Pregnancy. °· Obesity. Excess weight is more likely to cause a problem for women than for men. °· Bladder stones or other bladder problems. °· Caffeine. °· Alcohol. °· Medications. For example, drugs that help the body get rid of extra fluid (diuretics) increase urine production. Some other medicines must be taken with lots of fluids. °· Muscle or nerve weakness. This might be the result of a spinal cord injury, a stroke, multiple sclerosis, or Parkinson disease. °· Long-standing diabetes can decrease the sensation of the bladder. This loss of sensation makes it harder to sense the bladder needs to be emptied. Over a period of years, the bladder is stretched out by constant overfilling. This weakens the bladder muscles so that the bladder does not empty well and has less capacity to fill with new urine. °· Interstitial cystitis (also called painful bladder syndrome). This condition develops because the tissues that line the inside of the bladder are inflamed (inflammation is the body's way of reacting to injury or infection). It causes pain and frequent   urination. It occurs in women more often than in men. °DIAGNOSIS  °· To decide what might be causing your urinary frequency, your health care provider will probably: °¨ Ask about symptoms you have noticed. °¨ Ask about your overall health. This will include questions about any medications you are taking. °¨ Do a physical examination. °· Order some tests. These might include: °¨ A blood test to check for diabetes or other health issues that could be contributing  to the problem. °¨ Urine testing. This could measure the flow of urine and the pressure on the bladder. °¨ A test of your neurological system (the brain, spinal cord, and nerves). This is the system that senses the need to urinate. °¨ A bladder test to check whether it is emptying completely when you urinate. °¨ Cystoscopy. This test uses a thin tube with a tiny camera on it. It offers a look inside your urethra and bladder to see if there are problems. °¨ Imaging tests. You might be given a contrast dye and then asked to urinate. X-rays are taken to see how your bladder is working. °TREATMENT  °It is important for you to be evaluated to determine if the amount or frequency that you have is unusual or abnormal. If it is found to be abnormal, the cause should be determined and this can usually be found out easily. Depending upon the cause, treatment could include medication, stimulation of the nerves, or surgery. °There are not too many things that you can do as an individual to change your urinary frequency. It is important that you balance the amount of fluid intake needed to compensate for your activity and the temperature. Medical problems will be diagnosed and taken care of by your physician. There is no particular bladder training such as Kegel exercises that you can do to help urinary frequency. This is an exercise that is usually recommended for people who have leaking of urine when they laugh, cough, or sneeze. °HOME CARE INSTRUCTIONS  °· Take any medications your health care provider prescribed or suggested. Follow the directions carefully. °· Practice any lifestyle changes that are recommended. These might include: °¨ Drinking less fluid or drinking at different times of the day. If you need to urinate often during the night, for example, you may need to stop drinking fluids early in the evening. °¨ Cutting down on caffeine or alcohol. They both can make you need to urinate more often than normal. Caffeine  is found in coffee, tea, and sodas. °¨ Losing weight, if that is recommended. °· Keep a journal or a log. You might be asked to record how much you drink and when and where you feel the need to urinate. This will also help evaluate how well the treatment provided by your physician is working. °SEEK MEDICAL CARE IF:  °· Your need to urinate often gets worse. °· You feel increased pain or irritation when you urinate. °· You notice blood in your urine. °· You have questions about any medications that your health care provider recommended. °· You notice blood, pus, or swelling at the site of any test or treatment procedure. °· You develop a fever of more than 100.5°F (38.1°C). °SEEK IMMEDIATE MEDICAL CARE IF:  °You develop a fever of more than 102.0°F (38.9°C). °  °This information is not intended to replace advice given to you by your health care provider. Make sure you discuss any questions you have with your health care provider. °  °Document Released: 09/10/2009 Document Revised:   12/05/2014 Document Reviewed: 09/10/2009 °Elsevier Interactive Patient Education ©2016 [employer] ° °

## 2015-09-30 NOTE — Addendum Note (Signed)
Addended by: Harl Bowie on: 09/30/2015 10:53 AM   Modules accepted: Orders

## 2015-10-01 LAB — URINE CULTURE: Colony Count: 55000

## 2015-10-02 ENCOUNTER — Encounter: Payer: Self-pay | Admitting: Family Medicine

## 2015-10-21 ENCOUNTER — Ambulatory Visit (INDEPENDENT_AMBULATORY_CARE_PROVIDER_SITE_OTHER): Payer: 59 | Admitting: Physician Assistant

## 2015-10-21 ENCOUNTER — Encounter: Payer: Self-pay | Admitting: Physician Assistant

## 2015-10-21 VITALS — BP 137/89 | HR 89 | Temp 97.8°F | Resp 16 | Ht 67.0 in | Wt 194.0 lb

## 2015-10-21 DIAGNOSIS — J019 Acute sinusitis, unspecified: Secondary | ICD-10-CM

## 2015-10-21 DIAGNOSIS — B9689 Other specified bacterial agents as the cause of diseases classified elsewhere: Secondary | ICD-10-CM

## 2015-10-21 MED ORDER — PREDNISONE 20 MG PO TABS: 20.0000 mg | ORAL_TABLET | Freq: Every day | ORAL | Status: DC

## 2015-10-21 MED ORDER — DOXYCYCLINE HYCLATE 100 MG PO CAPS: 100.0000 mg | ORAL_CAPSULE | Freq: Two times a day (BID) | ORAL | Status: DC

## 2015-10-21 NOTE — Progress Notes (Signed)
Pre visit review using our clinic review tool, if applicable. No additional management support is needed unless otherwise documented below in the visit note/SLS  

## 2015-10-21 NOTE — Progress Notes (Signed)
Patient presents to clinic today c/o 1 week of worsening sinus pressure, sinus pain, facial pain and fatigue. Denies fever, chills or aches. Endorses mild cough. Was seen at [facility] on Sunday and given Rx Biaxin BID. Patient endorses taking as directed but states symptoms continue to progress.  Past Medical History  Diagnosis Date  . Ovarian cyst   . LGSIL (low grade squamous intraepithelial lesion) on Pap smear   . Anxiety   . Depression   . IBS (irritable bowel syndrome)   . Dysplastic nevus   . HSV-2 infection   . Abnormal Pap smear     Current Outpatient Prescriptions on File Prior to Visit  Medication Sig Dispense Refill  . albuterol (PROVENTIL HFA;VENTOLIN HFA) 108 (90 BASE) MCG/ACT inhaler Inhale 2 puffs into the lungs 4 (four) times daily. 1 Inhaler 2  . escitalopram (LEXAPRO) 20 MG tablet Take 1 tablet by mouth  daily 90 tablet 1  . fluticasone (FLONASE) 50 MCG/ACT nasal spray Place 2 sprays into both nostrils daily. 16 g 11  . losartan-hydrochlorothiazide (HYZAAR) 50-12.5 MG per tablet Take 1 tablet by mouth daily. 90 tablet 3  . NATURE-THROID 97.5 MG TABS Take 1 tablet by mouth daily.    . Olopatadine HCl (PATADAY) 0.2 % SOLN Apply to eye daily. Both Eyes.    . SYMBICORT 160-4.5 MCG/ACT inhaler Use 2 puffs two times daily 30.6 g 4   No current facility-administered medications on file prior to visit.    Allergies  Allergen Reactions  . Penicillins     hives    Family History  Problem Relation Age of Onset  . Cancer Maternal Aunt   . Cancer Maternal Grandfather     prostate/lung  . Hypertension Father   . Hyperlipidemia Mother   . Cancer Maternal Uncle     melanoma  . Deep vein thrombosis Maternal Grandmother     Social History   Social History  . Marital Status: Married    Spouse Name: N/A  . Number of Children: 1  . Years of Education: N/A   Occupational History  . sales    Social History Main Topics  . Smoking status: Former Smoker -- 7 years   Types: Cigarettes    Quit date: 11/29/1999  . Smokeless tobacco: Never Used     Comment: "couple of cigs on weekend"  . Alcohol Use: Yes     Comment: 2-3 beers per week  . Drug Use: No  . Sexual Activity: Yes    Birth Control/ Protection: Pill   Other Topics Concern  . None   Social History Narrative   Review of Systems - See HPI.  All other ROS are negative.  BP 137/89 mmHg  Pulse 89  Temp(Src) 97.8 F (36.6 C) (Oral)  Resp 16  Ht 5\' 7"  (1.702 m)  Wt 194 lb (87.998 kg)  BMI 30.38 kg/m2  SpO2 100%  LMP 10/08/2015  Physical Exam  Constitutional: She is oriented to person, place, and time and well-developed, well-nourished, and in no distress.  HENT:  Head: Normocephalic and atraumatic.  Right Ear: External ear normal.  Left Ear: External ear normal.  Nose: Nose normal.  Mouth/Throat: Oropharynx is clear and moist. No oropharyngeal exudate.  + TTP of sinuses noted on exam  Eyes: Conjunctivae are normal.  Cardiovascular: Normal rate, regular rhythm, normal heart sounds and intact distal pulses.   Pulmonary/Chest: Effort normal and breath sounds normal. No respiratory distress. She has no wheezes. She has no rales.  She exhibits no tenderness.  Neurological: She is alert and oriented to person, place, and time.  Skin: Skin is warm and dry. No rash noted.  Psychiatric: Affect normal.  Vitals reviewed.   Recent Results (from the past 2160 hour(s))  POCT Urinalysis Dipstick     Status: Normal   Collection Time: 09/29/15  5:13 PM  Result Value Ref Range   Color, UA Yellow    Clarity, UA Clear    Glucose, UA Neg    Bilirubin, UA Neg    Ketones, UA Neg    Spec Grav, UA 1.010    Blood, UA Neg    pH, UA 7.5    Protein, UA Neg    Urobilinogen, UA 2.0    Nitrite, UA Neg    Leukocytes, UA Negative Negative  Urine Culture     Status: None   Collection Time: 09/30/15 10:53 AM  Result Value Ref Range   Colony Count 55,000 COLONIES/ML    Organism ID, Bacteria Multiple  bacterial morphotypes present, none    Organism ID, Bacteria predominant. Suggest appropriate recollection if     Organism ID, Bacteria clinically indicated.     Assessment/Plan: Acute bacterial sinusitis Stop Biaxin. Rx Doxycycline.  Increase fluids.  Rest.  Saline nasal spray.  Probiotic.  Mucinex as directed.  Humidifier in bedroom. Prednisone 20 mg QD x 3 days to help with sinus inflammation.  Call or return to clinic if symptoms are not improving.

## 2015-10-21 NOTE — Patient Instructions (Signed)
Please take antibiotic as directed.  Increase fluid intake.  Use Saline nasal spray.  Take a daily multivitamin. Use steroid once daily for 3 days.  Place a humidifier in the bedroom.  Please call or return clinic if symptoms are not improving.  Sinusitis Sinusitis is redness, soreness, and swelling (inflammation) of the paranasal sinuses. Paranasal sinuses are air pockets within the bones of your face (beneath the eyes, the middle of the forehead, or above the eyes). In healthy paranasal sinuses, mucus is able to drain out, and air is able to circulate through them by way of your nose. However, when your paranasal sinuses are inflamed, mucus and air can become trapped. This can allow bacteria and other germs to grow and cause infection. Sinusitis can develop quickly and last only a short time (acute) or continue over a long period (chronic). Sinusitis that lasts for more than 12 weeks is considered chronic.  CAUSES  Causes of sinusitis include:  Allergies.  Structural abnormalities, such as displacement of the cartilage that separates your nostrils (deviated septum), which can decrease the air flow through your nose and sinuses and affect sinus drainage.  Functional abnormalities, such as when the small hairs (cilia) that line your sinuses and help remove mucus do not work properly or are not present. SYMPTOMS  Symptoms of acute and chronic sinusitis are the same. The primary symptoms are pain and pressure around the affected sinuses. Other symptoms include:  Upper toothache.  Earache.  Headache.  Bad breath.  Decreased sense of smell and taste.  A cough, which worsens when you are lying flat.  Fatigue.  Fever.  Thick drainage from your nose, which often is green and may contain pus (purulent).  Swelling and warmth over the affected sinuses. DIAGNOSIS  Your caregiver will perform a physical exam. During the exam, your caregiver may:  Look in your nose for signs of abnormal  growths in your nostrils (nasal polyps).  Tap over the affected sinus to check for signs of infection.  View the inside of your sinuses (endoscopy) with a special imaging device with a light attached (endoscope), which is inserted into your sinuses. If your caregiver suspects that you have chronic sinusitis, one or more of the following tests may be recommended:  Allergy tests.  Nasal culture A sample of mucus is taken from your nose and sent to a lab and screened for bacteria.  Nasal cytology A sample of mucus is taken from your nose and examined by your caregiver to determine if your sinusitis is related to an allergy. TREATMENT  Most cases of acute sinusitis are related to a viral infection and will resolve on their own within 10 days. Sometimes medicines are prescribed to help relieve symptoms (pain medicine, decongestants, nasal steroid sprays, or saline sprays).  However, for sinusitis related to a bacterial infection, your caregiver will prescribe antibiotic medicines. These are medicines that will help kill the bacteria causing the infection.  Rarely, sinusitis is caused by a fungal infection. In theses cases, your caregiver will prescribe antifungal medicine. For some cases of chronic sinusitis, surgery is needed. Generally, these are cases in which sinusitis recurs more than 3 times per year, despite other treatments. HOME CARE INSTRUCTIONS   Drink plenty of water. Water helps thin the mucus so your sinuses can drain more easily.  Use a humidifier.  Inhale steam 3 to 4 times a day (for example, sit in the bathroom with the shower running).  Apply a warm, moist washcloth to your  face 3 to 4 times a day, or as directed by your caregiver.  Use saline nasal sprays to help moisten and clean your sinuses.  Take over-the-counter or prescription medicines for pain, discomfort, or fever only as directed by your caregiver. SEEK IMMEDIATE MEDICAL CARE IF:  You have increasing pain or  severe headaches.  You have nausea, vomiting, or drowsiness.  You have swelling around your face.  You have vision problems.  You have a stiff neck.  You have difficulty breathing. MAKE SURE YOU:   Understand these instructions.  Will watch your condition.  Will get help right away if you are not doing well or get worse. Document Released: 11/14/2005 Document Revised: 02/06/2012 Document Reviewed: 11/29/2011 [facility] Patient Information 2014 [place], [place].

## 2015-10-21 NOTE — Assessment & Plan Note (Signed)
Stop Biaxin. Rx Doxycycline.  Increase fluids.  Rest.  Saline nasal spray.  Probiotic.  Mucinex as directed.  Humidifier in bedroom. Prednisone 20 mg QD x 3 days to help with sinus inflammation.  Call or return to clinic if symptoms are not improving.

## 2015-10-24 DIAGNOSIS — E039 Hypothyroidism, unspecified: Secondary | ICD-10-CM | POA: Insufficient documentation

## 2015-10-24 DIAGNOSIS — E871 Hypo-osmolality and hyponatremia: Secondary | ICD-10-CM | POA: Insufficient documentation

## 2015-12-21 ENCOUNTER — Other Ambulatory Visit: Payer: Self-pay | Admitting: Family Medicine

## 2016-02-22 ENCOUNTER — Encounter: Payer: Self-pay | Admitting: Medical

## 2016-02-22 ENCOUNTER — Ambulatory Visit (INDEPENDENT_AMBULATORY_CARE_PROVIDER_SITE_OTHER): Payer: 59 | Admitting: Medical

## 2016-02-22 VITALS — BP 122/80 | HR 89 | Temp 98.1°F | Ht 67.0 in | Wt 175.8 lb

## 2016-02-22 DIAGNOSIS — J0100 Acute maxillary sinusitis, unspecified: Secondary | ICD-10-CM | POA: Diagnosis not present

## 2016-02-22 DIAGNOSIS — J209 Acute bronchitis, unspecified: Secondary | ICD-10-CM

## 2016-02-22 MED ORDER — FLUTICASONE PROPIONATE 50 MCG/ACT NA SUSP: 2.0000 | Freq: Every day | NASAL | Status: DC

## 2016-02-22 MED ORDER — DOXYCYCLINE HYCLATE 100 MG PO TABS: 100.0000 mg | ORAL_TABLET | Freq: Two times a day (BID) | ORAL | Status: DC

## 2016-02-22 NOTE — Progress Notes (Signed)
Pre visit review using our clinic review tool, if applicable. No additional management support is needed unless otherwise documented below in the visit note. 

## 2016-02-22 NOTE — Patient Instructions (Addendum)
You have had flu recently but were not treated due to passing time frame of treatment.   You appear to have probable secondary sinus infection and possible early bronchitis.  Will rx doxycycline antibiotic.  For cough continue delsym. For nasal congestion rx flonase.  Follow up in 7-10 days or as needed.

## 2016-02-22 NOTE — Progress Notes (Signed)
Subjective:    Patient ID: Carolyn Hebert, female    DOB: 01/12/1976, 40 y.o.   MRN: [id number]  HPI  Pt in with report of flu like illness on wed with fever, chills, st and aches. Pt went to urgent care on saturda and they told flu b by test. But she had passed treatment time. So advised rest, hydrate and ibuprofen. Pt states sinus pressure and chest congestion. Pt states by historically will get sinus infection and bronchitis easily.  Pt states her asthma has been stable.  LMP- February 15, 2016.   Review of Systems  Constitutional: Positive for fever, chills and fatigue.  HENT: Positive for congestion, sinus pressure and sore throat.   Respiratory: Positive for cough. Negative for chest tightness, shortness of breath and wheezing.        Coughing up mucous.  Musculoskeletal: Positive for myalgias and back pain.  Skin: Negative for rash.  Neurological: Negative for dizziness.  Hematological: Negative for adenopathy. Does not bruise/bleed easily.  Psychiatric/Behavioral: Negative for behavioral problems and confusion.    Past Medical History  Diagnosis Date  . Ovarian cyst   . LGSIL (low grade squamous intraepithelial lesion) on Pap smear   . Anxiety   . Depression   . IBS (irritable bowel syndrome)   . Dysplastic nevus   . HSV-2 infection   . Abnormal Pap smear     Social History   Social History  . Marital Status: Married    Spouse Name: N/A  . Number of Children: 1  . Years of Education: N/A   Occupational History  . sales    Social History Main Topics  . Smoking status: Former Smoker -- 7 years    Types: Cigarettes    Quit date: 11/29/1999  . Smokeless tobacco: Never Used     Comment: "couple of cigs on weekend"  . Alcohol Use: Yes     Comment: 2-3 beers per week  . Drug Use: No  . Sexual Activity: Yes    Birth Control/ Protection: Pill   Other Topics Concern  . Not on file   Social History Narrative    Past Surgical History  Procedure  Laterality Date  . Cystectomy    . Cesarean section    . Laparoscopy    . Wisdom tooth extraction    . Septoplasty with ethmoidectomy, and maxillary antrostomy  2011     [place] , [place]    Family History  Problem Relation Age of Onset  . Cancer Maternal Aunt   . Cancer Maternal Grandfather     prostate/lung  . Hypertension Father   . Hyperlipidemia Mother   . Cancer Maternal Uncle     melanoma  . Deep vein thrombosis Maternal Grandmother     Allergies  Allergen Reactions  . Penicillins     hives    Current Outpatient Prescriptions on File Prior to Visit  Medication Sig Dispense Refill  . albuterol (PROVENTIL HFA;VENTOLIN HFA) 108 (90 BASE) MCG/ACT inhaler Inhale 2 puffs into the lungs 4 (four) times daily. 1 Inhaler 2  . escitalopram (LEXAPRO) 20 MG tablet Take 1 tablet by mouth  daily 90 tablet 1  . fluticasone (FLONASE) 50 MCG/ACT nasal spray Use 1 spray in each nostril two times daily as needed  for rhinitis 48 g 1  . ibuprofen (ADVIL,MOTRIN) 800 MG tablet Take 800 mg by mouth every 8 (eight) hours as needed.    Marland Kitchen losartan-hydrochlorothiazide (HYZAAR) 50-12.5 MG per tablet Take 1 tablet  by mouth daily. 90 tablet 3  . NATURE-THROID 97.5 MG TABS Take 1 tablet by mouth daily.    . Olopatadine HCl (PATADAY) 0.2 % SOLN Apply to eye daily. Both Eyes.    . predniSONE (DELTASONE) 20 MG tablet Take 1 tablet (20 mg total) by mouth daily with breakfast. 3 tablet 0  . SYMBICORT 160-4.5 MCG/ACT inhaler Use 2 puffs two times daily 30.6 g 4   No current facility-administered medications on file prior to visit.    BP 122/80 mmHg  Pulse 89  Temp(Src) 98.1 F (36.7 C) (Oral)  Ht 5\' 7"  (1.702 m)  Wt 175 lb 12.8 oz (79.742 kg)  BMI 27.53 kg/m2  SpO2 98%  LMP 02/15/2016       Objective:   Physical Exam  General  Mental Status - Alert. General Appearance - Well groomed. Not in acute distress.  Skin Rashes- No Rashes.  HEENT Head- Normal. Ear Auditory Canal - Left-  Normal. Right - Normal.Tympanic Membrane- Left- Normal. Right- Normal. Eye Sclera/Conjunctiva- Left- Normal. Right- Normal. Nose & Sinuses Nasal Mucosa- Left-  Boggy and Congested. Right-  Boggy and  Congested.Bilateral maxillary and frontal sinus pressure. Mouth & Throat Lips: Upper Lip- Normal: no dryness, cracking, pallor, cyanosis, or vesicular eruption. Lower Lip-Normal: no dryness, cracking, pallor, cyanosis or vesicular eruption. Buccal Mucosa- Bilateral- No Aphthous ulcers. Oropharynx- No Discharge or Erythema. Tonsils: Characteristics- Bilateral- No Erythema or Congestion. Size/Enlargement- Bilateral- No enlargement. Discharge- bilateral-None.  Neck Neck- Supple. No Masses.   Chest and Lung Exam Auscultation: Breath Sounds:-Clear even and unlabored.  Cardiovascular Auscultation:Rythm- Regular, rate and rhythm. Murmurs & Other Heart Sounds:Ausculatation of the heart reveal- No Murmurs.  Lymphatic Head & Neck General Head & Neck Lymphatics: Bilateral: Description- No Localized lymphadenopathy.      Assessment & Plan:  You have had flu recently but were not treated due to passing time frame of treatment.   You appear to have probable secondary sinus infection and possible early bronchitis.  Will rx doxycycline antibiotic.  For cough continue delsym For nasal congestion rx flonase.  Follow up in 7-10 days or as needed.

## 2016-07-16 ENCOUNTER — Other Ambulatory Visit: Payer: Self-pay | Admitting: Family Medicine

## 2016-07-29 ENCOUNTER — Other Ambulatory Visit: Payer: Self-pay | Admitting: Family Medicine

## 2016-07-29 DIAGNOSIS — I10 Essential (primary) hypertension: Secondary | ICD-10-CM

## 2016-09-21 ENCOUNTER — Ambulatory Visit (INDEPENDENT_AMBULATORY_CARE_PROVIDER_SITE_OTHER): Payer: 59 | Admitting: Medical

## 2016-09-21 VITALS — BP 110/72 | HR 78 | Temp 98.2°F | Resp 16 | Ht 67.0 in | Wt 170.8 lb

## 2016-09-21 DIAGNOSIS — J0100 Acute maxillary sinusitis, unspecified: Secondary | ICD-10-CM

## 2016-09-21 MED ORDER — DOXYCYCLINE HYCLATE 100 MG PO TABS: 100.0000 mg | ORAL_TABLET | Freq: Two times a day (BID) | ORAL | 0 refills | Status: DC

## 2016-09-21 NOTE — Progress Notes (Signed)
Subjective:    Patient ID: Carolyn Hebert, female    DOB: 04-Jan-1976, 40 y.o.   MRN: [id number]  HPI   Pt in for sinus pain since Sunday. Getting worse. More pain on left side. Pt has history of sinus surgery in past(2011). She denies preceding congestion but states weather changes often causes symptoms. She describes in past quick onset of sinus infection  Some chills and mild submandibular pain. No diffuse achiness. No severe st.  LMP- 2 weeks ago.      Review of Systems  Constitutional: Positive for chills. Negative for fatigue and fever.  HENT: Positive for congestion, postnasal drip and sinus pressure.        Very faint minimal sore throat at best. States some pnd.  Respiratory: Negative for cough, chest tightness, shortness of breath and wheezing.   Cardiovascular: Negative for chest pain and palpitations.  Gastrointestinal: Negative for abdominal pain.  Musculoskeletal: Negative for back pain, myalgias and neck pain.  Neurological: Negative for dizziness, weakness, numbness and headaches.  Hematological: Negative for adenopathy. Does not bruise/bleed easily.    Past Medical History:  Diagnosis Date  . Abnormal Pap smear   . Anxiety   . Depression   . Dysplastic nevus   . HSV-2 infection   . IBS (irritable bowel syndrome)   . LGSIL (low grade squamous intraepithelial lesion) on Pap smear   . Ovarian cyst      Social History   Social History  . Marital status: Married    Spouse name: N/A  . Number of children: 1  . Years of education: N/A   Occupational History  . sales    Social History Main Topics  . Smoking status: Former Smoker    Years: 7.00    Types: Cigarettes    Quit date: 11/29/1999  . Smokeless tobacco: Never Used     Comment: "couple of cigs on weekend"  . Alcohol use Yes     Comment: 2-3 beers per week  . Drug use: No  . Sexual activity: Yes    Birth control/ protection: Pill   Other Topics Concern  . Not on file   Social History  Narrative  . No narrative on file    Past Surgical History:  Procedure Laterality Date  . CESAREAN SECTION    . CYSTECTOMY    . LAPAROSCOPY    . SEPTOPLASTY WITH ETHMOIDECTOMY, AND MAXILLARY ANTROSTOMY  [place] , [place]  . WISDOM TOOTH EXTRACTION      Family History  Problem Relation Age of Onset  . Cancer Maternal Aunt   . Cancer Maternal Grandfather     prostate/lung  . Hypertension Father   . Hyperlipidemia Mother   . Cancer Maternal Uncle     melanoma  . Deep vein thrombosis Maternal Grandmother     Allergies  Allergen Reactions  . Penicillins     hives    Current Outpatient Prescriptions on File Prior to Visit  Medication Sig Dispense Refill  . escitalopram (LEXAPRO) 20 MG tablet Take 1 tablet by mouth  daily 90 tablet 3  . fluticasone (FLONASE) 50 MCG/ACT nasal spray Place 2 sprays into both nostrils daily. 16 g 2  . NATURE-THROID 97.5 MG TABS Take 1 tablet by mouth daily.    Marland Kitchen albuterol (PROVENTIL HFA;VENTOLIN HFA) 108 (90 BASE) MCG/ACT inhaler Inhale 2 puffs into the lungs 4 (four) times daily. (Patient not taking: Reported on 09/21/2016) 1 Inhaler 2  . ibuprofen (ADVIL,MOTRIN) 800  MG tablet Take 800 mg by mouth every 8 (eight) hours as needed.    Marland Kitchen losartan-hydrochlorothiazide (HYZAAR) 50-12.5 MG tablet Take 1 tablet by mouth daily. Office visit due now (Patient not taking: Reported on 09/21/2016) 90 tablet 0  . Olopatadine HCl (PATADAY) 0.2 % SOLN Apply to eye daily. Both Eyes.    . predniSONE (DELTASONE) 20 MG tablet Take 1 tablet (20 mg total) by mouth daily with breakfast. (Patient not taking: Reported on 09/21/2016) 3 tablet 0  . SYMBICORT 160-4.5 MCG/ACT inhaler Use 2 puffs two times daily (Patient not taking: Reported on 09/21/2016) 30.6 g 4   No current facility-administered medications on file prior to visit.     BP 110/72 (BP Location: Left Arm, Patient Position: Sitting, Cuff Size: Large)   Pulse 78   Temp 98.2 F (36.8 C) (Oral)   Resp 16    Ht 5\' 7"  (1.702 m)   Wt 170 lb 12.8 oz (77.5 kg)   LMP 09/07/2016 (Approximate)   SpO2 97%   BMI 26.75 kg/m       Objective:   Physical Exam  General  Mental Status - Alert. General Appearance - Well groomed. Not in acute distress.  Skin Rashes- No Rashes.  HEENT Head- Normal. Ear Auditory Canal - Left- Normal. Right - Normal.Tympanic Membrane- Left- Normal. Right- Normal. Eye Sclera/Conjunctiva- Left- Normal. Right- Normal. Nose & Sinuses Nasal Mucosa- Left-  Boggy and Congested. Right-  Boggy and  Congested.Bilateral maxillary and frontal sinus pressure. Mouth & Throat Lips: Upper Lip- Normal: no dryness, cracking, pallor, cyanosis, or vesicular eruption. Lower Lip-Normal: no dryness, cracking, pallor, cyanosis or vesicular eruption. Buccal Mucosa- Bilateral- No Aphthous ulcers. Oropharynx- No Discharge or Erythema. Tonsils: Characteristics- Bilateral- No Erythema or Congestion. Size/Enlargement- Bilateral- No enlargement. Discharge- bilateral-None.  Neck Neck- Supple. No Masses. No neck stiffness. No enlarged lymph nodes.   Chest and Lung Exam Auscultation: Breath Sounds:-Clear even and unlabored.  Cardiovascular Auscultation:Rythm- Regular, rate and rhythm. Murmurs & Other Heart Sounds:Ausculatation of the heart reveal- No Murmurs.  Lymphatic Head & Neck General Head & Neck Lymphatics: Bilateral: Description- No Localized lymphadenopathy.       Assessment & Plan:   You do appear to have sinus infection  again as in past.   Will rx doxycycline antibiotic.  If develop cough continue delsym For nasal congestion rx flonase  If signs and symptoms persist or worsen please notify us.  Follow up 7 days or as needed  Tonette Koehne, Percell Miller, [employer]

## 2016-09-21 NOTE — Progress Notes (Signed)
Pre visit review using our clinic review tool, if applicable. No additional management support is needed unless otherwise documented below in the visit note. 

## 2016-09-21 NOTE — Patient Instructions (Addendum)
You do appear to have  sinus infection again as in past.  Will rx doxycycline antibiotic.  If develop cough continue delsym For nasal congestion rx flonase  If signs and symptoms persist or worsen please notify us.   Follow up 7 days or as needed

## 2016-10-14 ENCOUNTER — Other Ambulatory Visit: Payer: Self-pay | Admitting: Medical

## 2016-10-14 NOTE — Telephone Encounter (Signed)
Pt requesting refill on Doxycycline. See Pt's message below:   doxycycline (VIBRA-TABS) 100 MG tablet [[place], Percell Miller, PA-C]   Patient Comment: Windell Moulding, I still have gross green stuff coming out of my nose, therefore, I do not think the sinus infection is completely gone. I am not sure if you would recommend a different medicine or just more days?    Preferred pharmacy: [facility]

## 2016-10-14 NOTE — Telephone Encounter (Signed)
I saw pt on Sep 21, 2016. 23 days ago. That is very long time. She needs to be seen again. We have very short week. Call pt Monday morning and advise so she can be seen before thanksgiving.

## 2016-10-21 ENCOUNTER — Other Ambulatory Visit: Payer: Self-pay | Admitting: Family Medicine

## 2016-10-21 DIAGNOSIS — I10 Essential (primary) hypertension: Secondary | ICD-10-CM

## 2016-10-27 ENCOUNTER — Other Ambulatory Visit: Payer: Self-pay | Admitting: Pulmonary Disease

## 2016-12-06 DIAGNOSIS — R0602 Shortness of breath: Secondary | ICD-10-CM | POA: Diagnosis not present

## 2016-12-06 DIAGNOSIS — R42 Dizziness and giddiness: Secondary | ICD-10-CM | POA: Diagnosis not present

## 2017-01-12 ENCOUNTER — Other Ambulatory Visit: Payer: Self-pay | Admitting: Family Medicine

## 2017-01-12 DIAGNOSIS — I10 Essential (primary) hypertension: Secondary | ICD-10-CM

## 2017-01-18 DIAGNOSIS — M79675 Pain in left toe(s): Secondary | ICD-10-CM | POA: Diagnosis not present

## 2017-01-18 DIAGNOSIS — L03032 Cellulitis of left toe: Secondary | ICD-10-CM | POA: Diagnosis not present

## 2017-01-18 DIAGNOSIS — L600 Ingrowing nail: Secondary | ICD-10-CM | POA: Diagnosis not present

## 2017-01-19 DIAGNOSIS — R5383 Other fatigue: Secondary | ICD-10-CM | POA: Diagnosis not present

## 2017-01-27 DIAGNOSIS — R197 Diarrhea, unspecified: Secondary | ICD-10-CM | POA: Diagnosis not present

## 2017-02-01 DIAGNOSIS — M79675 Pain in left toe(s): Secondary | ICD-10-CM | POA: Diagnosis not present

## 2017-02-01 DIAGNOSIS — L600 Ingrowing nail: Secondary | ICD-10-CM | POA: Diagnosis not present

## 2017-02-01 DIAGNOSIS — L03032 Cellulitis of left toe: Secondary | ICD-10-CM | POA: Diagnosis not present

## 2017-05-04 DIAGNOSIS — Z1231 Encounter for screening mammogram for malignant neoplasm of breast: Secondary | ICD-10-CM | POA: Diagnosis not present

## 2017-05-04 DIAGNOSIS — Z01419 Encounter for gynecological examination (general) (routine) without abnormal findings: Secondary | ICD-10-CM | POA: Diagnosis not present

## 2017-05-04 LAB — HM PAP SMEAR

## 2017-05-09 DIAGNOSIS — R3915 Urgency of urination: Secondary | ICD-10-CM | POA: Diagnosis not present

## 2017-05-12 ENCOUNTER — Encounter: Payer: Self-pay | Admitting: Family

## 2017-05-12 ENCOUNTER — Ambulatory Visit (INDEPENDENT_AMBULATORY_CARE_PROVIDER_SITE_OTHER): Payer: 59 | Admitting: Family

## 2017-05-12 VITALS — BP 124/72 | HR 71 | Temp 98.6°F | Resp 16 | Ht 67.0 in | Wt 166.6 lb

## 2017-05-12 DIAGNOSIS — J020 Streptococcal pharyngitis: Secondary | ICD-10-CM | POA: Diagnosis not present

## 2017-05-12 DIAGNOSIS — J029 Acute pharyngitis, unspecified: Secondary | ICD-10-CM

## 2017-05-12 LAB — POCT RAPID STREP A (OFFICE): Rapid Strep A Screen: POSITIVE — AB

## 2017-05-12 MED ORDER — CEFDINIR 300 MG PO CAPS: 300.0000 mg | ORAL_CAPSULE | Freq: Two times a day (BID) | ORAL | 0 refills | Status: DC

## 2017-05-12 NOTE — Patient Instructions (Signed)
Please begin cefdinir for strep throat. Call if symptoms worsen or if they fail to improve in the next 3 days.

## 2017-05-12 NOTE — Progress Notes (Signed)
Subjective:    Patient ID: Carolyn Hebert, female    DOB: [birth year], 41 y.o.   MRN: [id number]  HPI  Sore throat x 2 days.  + fatigue, denies fever at home. Has felt warm then cold.  + ear pain.    Review of Systems     Past Medical History:  Diagnosis Date  . Abnormal Pap smear   . Anxiety   . Depression   . Dysplastic nevus   . HSV-2 infection   . IBS (irritable bowel syndrome)   . LGSIL (low grade squamous intraepithelial lesion) on Pap smear   . Ovarian cyst      Social History   Social History  . Marital status: Married    Spouse name: N/A  . Number of children: 1  . Years of education: N/A   Occupational History  . sales    Social History Main Topics  . Smoking status: Former Smoker    Years: 7.00    Types: Cigarettes    Quit date: 11/29/1999  . Smokeless tobacco: Never Used     Comment: "couple of cigs on weekend"  . Alcohol use Yes     Comment: 2-3 beers per week  . Drug use: No  . Sexual activity: Yes    Birth control/ protection: Pill   Other Topics Concern  . Not on file   Social History Narrative  . No narrative on file    Past Surgical History:  Procedure Laterality Date  . CESAREAN SECTION    . CYSTECTOMY    . LAPAROSCOPY    . SEPTOPLASTY WITH ETHMOIDECTOMY, AND MAXILLARY ANTROSTOMY  [place] , [place]  . WISDOM TOOTH EXTRACTION      Family History  Problem Relation Age of Onset  . Cancer Maternal Aunt   . Cancer Maternal Grandfather        prostate/lung  . Hypertension Father   . Hyperlipidemia Mother   . Cancer Maternal Uncle        melanoma  . Deep vein thrombosis Maternal Grandmother     Allergies  Allergen Reactions  . Penicillins     hives    Current Outpatient Prescriptions on File Prior to Visit  Medication Sig Dispense Refill  . escitalopram (LEXAPRO) 20 MG tablet Take 1 tablet by mouth  daily 90 tablet 3  . fluticasone (FLONASE) 50 MCG/ACT nasal spray Place 2 sprays into both nostrils daily. 16 g 2  .  ibuprofen (ADVIL,MOTRIN) 800 MG tablet Take 800 mg by mouth every 8 (eight) hours as needed.    . Olopatadine HCl (PATADAY) 0.2 % SOLN Apply to eye daily. Both Eyes.     No current facility-administered medications on file prior to visit.     BP 124/72   Pulse 71   Temp 98.6 F (37 C) (Oral)   Resp 16   Ht 5\' 7"  (1.702 m)   Wt 166 lb 9.6 oz (75.6 kg)   LMP 05/09/2017   SpO2 100%   BMI 26.09 kg/m    Objective:   Physical Exam  Constitutional: She is oriented to person, place, and time. She appears well-developed and well-nourished.  HENT:  Head: Normocephalic and atraumatic.  Right Ear: Tympanic membrane and ear canal normal.  Left Ear: Tympanic membrane normal.  Mouth/Throat: Posterior oropharyngeal erythema present. No oropharyngeal exudate or posterior oropharyngeal edema.  Eyes: No scleral icterus.  Cardiovascular: Normal rate, regular rhythm and normal heart sounds.   No murmur heard.  Pulmonary/Chest: Effort normal and breath sounds normal. No respiratory distress. She has no wheezes.  Musculoskeletal: She exhibits no edema.  Lymphadenopathy:    She has cervical adenopathy.  Neurological: She is alert and oriented to person, place, and time.  Skin: Skin is warm and dry.  Psychiatric: She has a normal mood and affect. Her behavior is normal. Judgment and thought content normal.          Assessment & Plan:  Strep pharyngitis- rx with cefdinir (pen allergic but has tolerated cefdinir).  Rapid strep positive. Advised pt to call if symptoms worsen or fail to improve and use tylenol, lozenges and tylenol/motrin prn.

## 2017-05-17 ENCOUNTER — Encounter: Payer: Self-pay | Admitting: *Deleted

## 2017-05-18 ENCOUNTER — Encounter: Payer: Self-pay | Admitting: Family

## 2017-05-19 ENCOUNTER — Ambulatory Visit (INDEPENDENT_AMBULATORY_CARE_PROVIDER_SITE_OTHER): Payer: 59 | Admitting: Family Medicine

## 2017-05-19 ENCOUNTER — Encounter: Payer: Self-pay | Admitting: Family Medicine

## 2017-05-19 VITALS — BP 128/78 | HR 67 | Temp 97.8°F | Resp 14 | Ht 67.0 in | Wt 164.4 lb

## 2017-05-19 DIAGNOSIS — J020 Streptococcal pharyngitis: Secondary | ICD-10-CM | POA: Diagnosis not present

## 2017-05-19 MED ORDER — AZITHROMYCIN 250 MG PO TABS: ORAL_TABLET | ORAL | 0 refills | Status: DC

## 2017-05-19 NOTE — Patient Instructions (Signed)

## 2017-05-19 NOTE — Progress Notes (Signed)
Patient ID: Carolyn Hebert, female    DOB: [birth year]  Age: 41 y.o. MRN: [id number]    Subjective:  Subjective  HPI Carolyn Hebert presents for f/u sore throat --- she still feels bad She is still on omnicef No new complaints   Review of Systems  Constitutional: Negative for appetite change, diaphoresis, fatigue and unexpected weight change.  HENT: Positive for sore throat. Negative for congestion, postnasal drip, sinus pressure, tinnitus, trouble swallowing and voice change.   Eyes: Negative for pain, redness and visual disturbance.  Respiratory: Negative for cough, chest tightness, shortness of breath and wheezing.   Cardiovascular: Negative for chest pain, palpitations and leg swelling.  Endocrine: Negative for cold intolerance, heat intolerance, polydipsia, polyphagia and polyuria.  Genitourinary: Negative for difficulty urinating, dysuria and frequency.  Neurological: Negative for dizziness, light-headedness, numbness and headaches.    History Past Medical History:  Diagnosis Date  . Abnormal Pap smear   . Anxiety   . Depression   . Dysplastic nevus   . HSV-2 infection   . IBS (irritable bowel syndrome)   . LGSIL (low grade squamous intraepithelial lesion) on Pap smear   . Ovarian cyst     She has a past surgical history that includes Cystectomy; Cesarean section; laparoscopy; Wisdom tooth extraction; and Septoplasty with ethmoidectomy, and maxillary antrostomy (2011).   Her family history includes Cancer in her maternal aunt, maternal grandfather, and maternal uncle; Deep vein thrombosis in her maternal grandmother; Hyperlipidemia in her mother; Hypertension in her father.She reports that she quit smoking about 17 years ago. Her smoking use included Cigarettes. She quit after 7.00 years of use. She has never used smokeless tobacco. She reports that she drinks alcohol. She reports that she does not use drugs.  Current Outpatient Prescriptions on File Prior to Visit   Medication Sig Dispense Refill  . cefdinir (OMNICEF) 300 MG capsule Take 1 capsule (300 mg total) by mouth 2 (two) times daily. 20 capsule 0  . escitalopram (LEXAPRO) 20 MG tablet Take 1 tablet by mouth  daily 90 tablet 3  . fluticasone (FLONASE) 50 MCG/ACT nasal spray Place 2 sprays into both nostrils daily. 16 g 2  . ibuprofen (ADVIL,MOTRIN) 800 MG tablet Take 800 mg by mouth every 8 (eight) hours as needed.    . Olopatadine HCl (PATADAY) 0.2 % SOLN Apply to eye daily. Both Eyes.    . Thyroid (NATURE-THROID) 113.75 MG TABS Take 1 tablet by mouth daily.     No current facility-administered medications on file prior to visit.      Objective:  Objective  Physical Exam  Constitutional: She is oriented to person, place, and time. She appears well-developed and well-nourished.  HENT:  Head: Normocephalic and atraumatic.  Right Ear: Hearing and tympanic membrane normal.  Left Ear: Hearing and tympanic membrane normal.  Nose: No mucosal edema, rhinorrhea or sinus tenderness. Right sinus exhibits no maxillary sinus tenderness and no frontal sinus tenderness. Left sinus exhibits no maxillary sinus tenderness and no frontal sinus tenderness.  Mouth/Throat: Posterior oropharyngeal erythema present. No oropharyngeal exudate or posterior oropharyngeal edema.  Eyes: Conjunctivae and EOM are normal.  Neck: Normal range of motion. Neck supple. No JVD present. Carotid bruit is not present. No thyromegaly present.  Cardiovascular: Normal rate, regular rhythm and normal heart sounds.   No murmur heard. Pulmonary/Chest: Effort normal and breath sounds normal. No respiratory distress. She has no wheezes. She has no rales. She exhibits no tenderness.  Musculoskeletal: She exhibits no edema.  Neurological: She is alert and oriented to person, place, and time.  Psychiatric: She has a normal mood and affect.  Nursing note and vitals reviewed.  BP 128/78 (BP Location: Left Arm, Patient Position: Sitting,  Cuff Size: Small)   Pulse 67   Temp 97.8 F (36.6 C) (Oral)   Resp 14   Ht 5\' 7"  (1.702 m)   Wt 164 lb 6 oz (74.6 kg)   LMP 05/09/2017   SpO2 98%   BMI 25.74 kg/m  Wt Readings from Last 3 Encounters:  05/19/17 164 lb 6 oz (74.6 kg)  05/12/17 166 lb 9.6 oz (75.6 kg)  09/21/16 170 lb 12.8 oz (77.5 kg)     Lab Results  Component Value Date   WBC 8.0 02/25/2014   HGB 13.9 02/25/2014   HCT 40.5 02/25/2014   PLT 269.0 02/25/2014   GLUCOSE 88 03/24/2014   CHOL 179 04/30/2013   TRIG 93.0 04/30/2013   HDL 75.40 04/30/2013   LDLCALC 85 04/30/2013   ALT 20 02/25/2014   AST 20 02/25/2014   NA 133 (L) 03/24/2014   K 4.5 03/24/2014   CL 98 03/24/2014   CREATININE 0.6 03/24/2014   BUN 10 03/24/2014   CO2 25 03/24/2014   TSH 1.70 04/30/2013   MICROALBUR 0.2 04/30/2013    No results found.   Assessment & Plan:  Plan  I am having Ms. Hebert start on azithromycin. I am also having her maintain her Olopatadine HCl, ibuprofen, fluticasone, escitalopram, Thyroid, and cefdinir.  Meds ordered this encounter  Medications  . azithromycin (ZITHROMAX Z-PAK) 250 MG tablet    Sig: As directed    Dispense:  6 each    Refill:  0    Problem List Items Addressed This Visit    None    Visit Diagnoses    Streptococcal sore throat    -  Primary   Relevant Medications   azithromycin (ZITHROMAX Z-PAK) 250 MG tablet   Other Relevant Orders   Culture, Group A Strep    stop omnicef-- take z pack and check culture  Follow-up: Return if symptoms worsen or fail to improve.  Ann Held, DO    Pre visit review using our clinic review tool, if applicable. No additional management support is needed unless otherwise documented below in the visit note.

## 2017-05-20 LAB — CULTURE, GROUP A STREP

## 2017-06-05 DIAGNOSIS — H1033 Unspecified acute conjunctivitis, bilateral: Secondary | ICD-10-CM | POA: Diagnosis not present

## 2017-06-06 ENCOUNTER — Other Ambulatory Visit: Payer: Self-pay | Admitting: Family Medicine

## 2017-06-06 DIAGNOSIS — E039 Hypothyroidism, unspecified: Secondary | ICD-10-CM | POA: Diagnosis not present

## 2017-06-06 DIAGNOSIS — E559 Vitamin D deficiency, unspecified: Secondary | ICD-10-CM | POA: Diagnosis not present

## 2017-06-06 DIAGNOSIS — R5383 Other fatigue: Secondary | ICD-10-CM | POA: Diagnosis not present

## 2017-06-07 DIAGNOSIS — D225 Melanocytic nevi of trunk: Secondary | ICD-10-CM | POA: Diagnosis not present

## 2017-06-07 DIAGNOSIS — Z808 Family history of malignant neoplasm of other organs or systems: Secondary | ICD-10-CM | POA: Diagnosis not present

## 2017-06-07 DIAGNOSIS — Z86018 Personal history of other benign neoplasm: Secondary | ICD-10-CM | POA: Diagnosis not present

## 2017-06-15 DIAGNOSIS — A6920 Lyme disease, unspecified: Secondary | ICD-10-CM | POA: Diagnosis not present

## 2017-06-15 DIAGNOSIS — E039 Hypothyroidism, unspecified: Secondary | ICD-10-CM | POA: Diagnosis not present

## 2017-06-15 DIAGNOSIS — E7210 Disorders of sulfur-bearing amino-acid metabolism, unspecified: Secondary | ICD-10-CM | POA: Diagnosis not present

## 2017-06-30 DIAGNOSIS — H04123 Dry eye syndrome of bilateral lacrimal glands: Secondary | ICD-10-CM | POA: Diagnosis not present

## 2017-07-24 DIAGNOSIS — H04123 Dry eye syndrome of bilateral lacrimal glands: Secondary | ICD-10-CM | POA: Diagnosis not present

## 2017-07-24 DIAGNOSIS — H16143 Punctate keratitis, bilateral: Secondary | ICD-10-CM | POA: Diagnosis not present

## 2017-08-09 ENCOUNTER — Other Ambulatory Visit: Payer: Self-pay | Admitting: Family Medicine

## 2017-08-11 ENCOUNTER — Other Ambulatory Visit: Payer: Self-pay

## 2017-08-11 MED ORDER — ESCITALOPRAM OXALATE 20 MG PO TABS: 20.0000 mg | ORAL_TABLET | Freq: Every day | ORAL | 0 refills | Status: DC

## 2017-08-11 NOTE — Telephone Encounter (Signed)
I have sent a message to the patient stating that she will need an OV before her next refill is due for Escitalopram and she can either schedule via MyChart or call the office/thx dmf

## 2017-09-19 DIAGNOSIS — E039 Hypothyroidism, unspecified: Secondary | ICD-10-CM | POA: Diagnosis not present

## 2017-09-19 DIAGNOSIS — R5383 Other fatigue: Secondary | ICD-10-CM | POA: Diagnosis not present

## 2017-09-19 DIAGNOSIS — E559 Vitamin D deficiency, unspecified: Secondary | ICD-10-CM | POA: Diagnosis not present

## 2017-09-22 ENCOUNTER — Other Ambulatory Visit: Payer: Self-pay

## 2017-09-22 ENCOUNTER — Encounter: Payer: Self-pay | Admitting: Family Medicine

## 2017-09-22 ENCOUNTER — Ambulatory Visit (INDEPENDENT_AMBULATORY_CARE_PROVIDER_SITE_OTHER): Payer: 59 | Admitting: Family Medicine

## 2017-09-22 ENCOUNTER — Telehealth: Payer: Self-pay

## 2017-09-22 VITALS — BP 158/98 | HR 88 | Temp 98.0°F | Ht 67.0 in | Wt 162.0 lb

## 2017-09-22 DIAGNOSIS — F418 Other specified anxiety disorders: Secondary | ICD-10-CM | POA: Diagnosis not present

## 2017-09-22 DIAGNOSIS — F329 Major depressive disorder, single episode, unspecified: Secondary | ICD-10-CM | POA: Diagnosis not present

## 2017-09-22 DIAGNOSIS — J302 Other seasonal allergic rhinitis: Secondary | ICD-10-CM

## 2017-09-22 DIAGNOSIS — I10 Essential (primary) hypertension: Secondary | ICD-10-CM

## 2017-09-22 MED ORDER — LISINOPRIL-HYDROCHLOROTHIAZIDE 10-12.5 MG PO TABS
1.0000 | ORAL_TABLET | Freq: Every day | ORAL | 3 refills | Status: DC
Start: 2017-09-22 — End: 2017-12-19

## 2017-09-22 MED ORDER — OLOPATADINE HCL 0.2 % OP SOLN: 1.0000 [drp] | Freq: Every day | OPHTHALMIC | 1 refills | Status: DC

## 2017-09-22 MED ORDER — OLOPATADINE HCL 0.2 % OP SOLN: 1.0000 [drp] | Freq: Every day | OPHTHALMIC | 5 refills | Status: AC

## 2017-09-22 MED ORDER — ESCITALOPRAM OXALATE 20 MG PO TABS: 20.0000 mg | ORAL_TABLET | Freq: Every day | ORAL | 0 refills | Status: DC

## 2017-09-22 MED ORDER — ESCITALOPRAM OXALATE 20 MG PO TABS: 20.0000 mg | ORAL_TABLET | Freq: Every day | ORAL | 3 refills | Status: DC

## 2017-09-22 MED ORDER — FLUTICASONE PROPIONATE 50 MCG/ACT NA SUSP: 2.0000 | Freq: Every day | NASAL | 2 refills | Status: DC

## 2017-09-22 MED ORDER — FLUTICASONE PROPIONATE 50 MCG/ACT NA SUSP: 2.0000 | Freq: Every day | NASAL | 2 refills | Status: AC

## 2017-09-22 NOTE — Patient Instructions (Signed)

## 2017-09-22 NOTE — Progress Notes (Signed)
Patient ID: Carolyn Hebert, female    DOB: 01/20/1976  Age: 41 y.o. MRN: [id number]    Subjective:  Subjective  HPI SAREE KROGH presents for pt needs a refill on her lexapro and allergy meds.    Review of Systems  Constitutional: Negative for activity change, appetite change, fatigue and unexpected weight change.  Respiratory: Negative for cough and shortness of breath.   Cardiovascular: Negative for chest pain and palpitations.  Psychiatric/Behavioral: Negative for behavioral problems and dysphoric mood. The patient is not nervous/anxious.     History Past Medical History:  Diagnosis Date  . Abnormal Pap smear   . Anxiety   . Depression   . Dysplastic nevus   . HSV-2 infection   . IBS (irritable bowel syndrome)   . LGSIL (low grade squamous intraepithelial lesion) on Pap smear   . Ovarian cyst     She has a past surgical history that includes Cystectomy; Cesarean section; laparoscopy; Wisdom tooth extraction; and Septoplasty with ethmoidectomy, and maxillary antrostomy (2011).   Her family history includes Cancer in her maternal aunt, maternal grandfather, and maternal uncle; Deep vein thrombosis in her maternal grandmother; Hyperlipidemia in her mother; Hypertension in her father.She reports that she quit smoking about 17 years ago. Her smoking use included Cigarettes. She quit after 7.00 years of use. She has never used smokeless tobacco. She reports that she drinks alcohol. She reports that she does not use drugs.  Current Outpatient Prescriptions on File Prior to Visit  Medication Sig Dispense Refill  . ibuprofen (ADVIL,MOTRIN) 800 MG tablet Take 800 mg by mouth every 8 (eight) hours as needed.    . Thyroid (NATURE-THROID) 113.75 MG TABS Take 1 tablet by mouth daily.     No current facility-administered medications on file prior to visit.      Objective:  Objective  Physical Exam  Constitutional: She is oriented to person, place, and time. She appears  well-developed and well-nourished. No distress.  HENT:  Head: Normocephalic and atraumatic.  Right Ear: External ear normal.  Left Ear: External ear normal.  Nose: Nose normal.  Mouth/Throat: Oropharynx is clear and moist.  Eyes: Pupils are equal, round, and reactive to light. Conjunctivae and EOM are normal. Right eye exhibits no discharge. Left eye exhibits no discharge.  Neck: Normal range of motion. Neck supple. No JVD present. No thyromegaly present.  Cardiovascular: Normal rate, regular rhythm, normal heart sounds and intact distal pulses.   No murmur heard. Pulmonary/Chest: Effort normal and breath sounds normal. No respiratory distress. She has no wheezes. She has no rales. She exhibits no tenderness.  Abdominal: Soft. Bowel sounds are normal. She exhibits no distension and no mass. There is no tenderness. There is no rebound and no guarding.  Musculoskeletal: Normal range of motion. She exhibits no edema or tenderness.  Lymphadenopathy:    She has no cervical adenopathy.  Neurological: She is alert and oriented to person, place, and time. She has normal reflexes. No cranial nerve deficit.  Skin: Skin is warm and dry. No rash noted. She is not diaphoretic. No erythema.  Psychiatric: She has a normal mood and affect. Her behavior is normal. Judgment and thought content normal.  Nursing note and vitals reviewed.  BP (!) 158/98   Pulse 88   Temp 98 F (36.7 C) (Oral)   Ht 5\' 7"  (1.702 m)   Wt 162 lb (73.5 kg)   LMP 09/17/2017 (Exact Date)   SpO2 99%   BMI 25.37 kg/m  Wt  Readings from Last 3 Encounters:  09/22/17 162 lb (73.5 kg)  05/19/17 164 lb 6 oz (74.6 kg)  05/12/17 166 lb 9.6 oz (75.6 kg)     Lab Results  Component Value Date   WBC 8.0 02/25/2014   HGB 13.9 02/25/2014   HCT 40.5 02/25/2014   PLT 269.0 02/25/2014   GLUCOSE 88 03/24/2014   CHOL 179 04/30/2013   TRIG 93.0 04/30/2013   HDL 75.40 04/30/2013   LDLCALC 85 04/30/2013   ALT 20 02/25/2014   AST 20  02/25/2014   NA 133 (L) 03/24/2014   K 4.5 03/24/2014   CL 98 03/24/2014   CREATININE 0.6 03/24/2014   BUN 10 03/24/2014   CO2 25 03/24/2014   TSH 1.70 04/30/2013   MICROALBUR 0.2 04/30/2013    No results found.   Assessment & Plan:  Plan  I have discontinued Ms. Muenzner's cefdinir and azithromycin. I am also having her start on lisinopril-hydrochlorothiazide. Additionally, I am having her maintain her ibuprofen, Thyroid, escitalopram, fluticasone, and Olopatadine HCl.  Meds ordered this encounter  Medications  . escitalopram (LEXAPRO) 20 MG tablet    Sig: Take 1 tablet (20 mg total) by mouth daily.    Dispense:  90 tablet    Refill:  3  . fluticasone (FLONASE) 50 MCG/ACT nasal spray    Sig: Place 2 sprays into both nostrils daily.    Dispense:  16 g    Refill:  2  . Olopatadine HCl (PATADAY) 0.2 % SOLN    Sig: Apply 1 drop to eye daily. Both Eyes.    Dispense:  5 mL    Refill:  5  . lisinopril-hydrochlorothiazide (PRINZIDE,ZESTORETIC) 10-12.5 MG tablet    Sig: Take 1 tablet by mouth daily.    Dispense:  90 tablet    Refill:  3    Problem List Items Addressed This Visit      Unprioritized   Depression    Stable Cont' meds      Relevant Medications   escitalopram (LEXAPRO) 20 MG tablet   Essential hypertension    Poorly controlled will alter medications, encouraged DASH diet, minimize caffeine and obtain adequate sleep. Report concerning symptoms and follow up as directed and as needed      Relevant Medications   lisinopril-hydrochlorothiazide (PRINZIDE,ZESTORETIC) 10-12.5 MG tablet    Other Visit Diagnoses    Depression with anxiety    -  Primary   Relevant Medications   escitalopram (LEXAPRO) 20 MG tablet   Seasonal allergies       Relevant Medications   fluticasone (FLONASE) 50 MCG/ACT nasal spray   Olopatadine HCl (PATADAY) 0.2 % SOLN      Follow-up: Return in about 3 months (around 12/23/2017), or if symptoms worsen or fail to improve.  Ann Held, DO

## 2017-09-22 NOTE — Assessment & Plan Note (Signed)
Poorly controlled will alter medications, encouraged DASH diet, minimize caffeine and obtain adequate sleep. Report concerning symptoms and follow up as directed and as needed 

## 2017-09-22 NOTE — Assessment & Plan Note (Signed)
Stable Cont meds 

## 2017-09-22 NOTE — Telephone Encounter (Signed)
PA initiated via Covermymeds; KEY: [id number]. Awaiting determination.

## 2017-09-25 NOTE — Telephone Encounter (Signed)
PA approved up to the plan's supply limit until 09/25/2018 or until the medication is no longer available under the benefit plan or the medication becomes subject to a pharmacy benefit coverage requirement, such as supply limits or notification, whichever occurs first.

## 2017-10-10 DIAGNOSIS — H04123 Dry eye syndrome of bilateral lacrimal glands: Secondary | ICD-10-CM | POA: Diagnosis not present

## 2017-10-10 DIAGNOSIS — H01111 Allergic dermatitis of right upper eyelid: Secondary | ICD-10-CM | POA: Diagnosis not present

## 2017-10-10 DIAGNOSIS — H01114 Allergic dermatitis of left upper eyelid: Secondary | ICD-10-CM | POA: Diagnosis not present

## 2017-12-19 ENCOUNTER — Ambulatory Visit: Payer: 59 | Admitting: Family Medicine

## 2017-12-19 ENCOUNTER — Encounter: Payer: Self-pay | Admitting: Family Medicine

## 2017-12-19 VITALS — BP 119/78 | HR 69 | Temp 98.2°F | Resp 16 | Wt 160.2 lb

## 2017-12-19 DIAGNOSIS — I10 Essential (primary) hypertension: Secondary | ICD-10-CM

## 2017-12-19 DIAGNOSIS — F329 Major depressive disorder, single episode, unspecified: Secondary | ICD-10-CM

## 2017-12-19 DIAGNOSIS — F418 Other specified anxiety disorders: Secondary | ICD-10-CM | POA: Diagnosis not present

## 2017-12-19 MED ORDER — LISINOPRIL 10 MG PO TABS: 10.0000 mg | ORAL_TABLET | Freq: Every day | ORAL | 3 refills | Status: DC

## 2017-12-19 MED ORDER — ESCITALOPRAM OXALATE 20 MG PO TABS: 20.0000 mg | ORAL_TABLET | Freq: Every day | ORAL | 3 refills | Status: DC

## 2017-12-19 NOTE — Progress Notes (Signed)
Patient ID: Carolyn Hebert, female    DOB: 1976/06/07  Age: 42 y.o. MRN: [id number]    Subjective:  Subjective  HPI Carolyn Hebert presents for bp check ---- still c/o some fatigue due to lyme.  She is seeing [facility] for the lymes.    Review of Systems  Constitutional: Positive for fatigue. Negative for activity change, appetite change and unexpected weight change.  Respiratory: Negative for cough and shortness of breath.   Cardiovascular: Negative for chest pain and palpitations.  Psychiatric/Behavioral: Negative for behavioral problems and dysphoric mood. The patient is not nervous/anxious.     History Past Medical History:  Diagnosis Date  . Abnormal Pap smear   . Anxiety   . Depression   . Dysplastic nevus   . HSV-2 infection   . IBS (irritable bowel syndrome)   . LGSIL (low grade squamous intraepithelial lesion) on Pap smear   . Ovarian cyst     She has a past surgical history that includes Cystectomy; Cesarean section; laparoscopy; Wisdom tooth extraction; and Septoplasty with ethmoidectomy, and maxillary antrostomy (2011).   Her family history includes Cancer in her maternal aunt, maternal grandfather, and maternal uncle; Deep vein thrombosis in her maternal grandmother; Hyperlipidemia in her mother; Hypertension in her father.She reports that she quit smoking about 18 years ago. Her smoking use included cigarettes. She quit after 7.00 years of use. she has never used smokeless tobacco. She reports that she drinks alcohol. She reports that she does not use drugs.  Current Outpatient Medications on File Prior to Visit  Medication Sig Dispense Refill  . fluticasone (FLONASE) 50 MCG/ACT nasal spray Place 2 sprays into both nostrils daily. 16 g 2  . Olopatadine HCl (PATADAY) 0.2 % SOLN Apply 1 drop to eye daily. Both Eyes. 5 mL 5  . Thyroid (NATURE-THROID) 113.75 MG TABS Take 1 tablet by mouth daily.     No current facility-administered medications on file  prior to visit.      Objective:  Objective  Physical Exam  Constitutional: She is oriented to person, place, and time. She appears well-developed and well-nourished.  HENT:  Head: Normocephalic and atraumatic.  Eyes: Conjunctivae and EOM are normal.  Neck: Normal range of motion. Neck supple. No JVD present. Carotid bruit is not present. No thyromegaly present.  Cardiovascular: Normal rate, regular rhythm and normal heart sounds.  No murmur heard. Pulmonary/Chest: Effort normal and breath sounds normal. No respiratory distress. She has no wheezes. She has no rales. She exhibits no tenderness.  Musculoskeletal: She exhibits no edema.  Neurological: She is alert and oriented to person, place, and time.  Psychiatric: She has a normal mood and affect. Her behavior is normal. Judgment and thought content normal.  Nursing note and vitals reviewed.  BP 119/78 (BP Location: Right Arm, Patient Position: Sitting, Cuff Size: Normal)   Pulse 69   Temp 98.2 F (36.8 C) (Oral)   Resp 16   Wt 160 lb 3.2 oz (72.7 kg)   SpO2 94%   BMI 25.09 kg/m  Wt Readings from Last 3 Encounters:  12/19/17 160 lb 3.2 oz (72.7 kg)  09/22/17 162 lb (73.5 kg)  05/19/17 164 lb 6 oz (74.6 kg)     Lab Results  Component Value Date   WBC 8.0 02/25/2014   HGB 13.9 02/25/2014   HCT 40.5 02/25/2014   PLT 269.0 02/25/2014   GLUCOSE 88 03/24/2014   CHOL 179 04/30/2013   TRIG 93.0 04/30/2013   HDL 75.40 04/30/2013  LDLCALC 85 04/30/2013   ALT 20 02/25/2014   AST 20 02/25/2014   NA 133 (L) 03/24/2014   K 4.5 03/24/2014   CL 98 03/24/2014   CREATININE 0.6 03/24/2014   BUN 10 03/24/2014   CO2 25 03/24/2014   TSH 1.70 04/30/2013   MICROALBUR 0.2 04/30/2013    No results found.   Assessment & Plan:  Plan  I have discontinued Demica Zook. Muenzner's ibuprofen and lisinopril-hydrochlorothiazide. I am also having her start on lisinopril. Additionally, I am having her maintain her Thyroid, fluticasone,  Olopatadine HCl, and escitalopram.  Meds ordered this encounter  Medications  . lisinopril (PRINIVIL,ZESTRIL) 10 MG tablet    Sig: Take 1 tablet (10 mg total) by mouth daily.    Dispense:  30 tablet    Refill:  3  . escitalopram (LEXAPRO) 20 MG tablet    Sig: Take 1 tablet (20 mg total) by mouth daily.    Dispense:  90 tablet    Refill:  3    Problem List Items Addressed This Visit      Unprioritized   Depression    Stable con't lexapro      Relevant Medications   escitalopram (LEXAPRO) 20 MG tablet   Essential hypertension - Primary    Well controlled, no changes to meds. Encouraged heart healthy diet such as the DASH diet and exercise as tolerated.  Change lisinopril hct to lisinopril 10 mg daily       Relevant Medications   lisinopril (PRINIVIL,ZESTRIL) 10 MG tablet   Other Relevant Orders   Lipid panel   Comprehensive metabolic panel    Other Visit Diagnoses    Depression with anxiety       Relevant Medications   escitalopram (LEXAPRO) 20 MG tablet      Follow-up: Return in about 6 months (around 06/18/2018), or if symptoms worsen or fail to improve.  Ann Held, DO

## 2017-12-19 NOTE — Assessment & Plan Note (Signed)
Well controlled, no changes to meds. Encouraged heart healthy diet such as the DASH diet and exercise as tolerated.  Change lisinopril hct to lisinopril 10 mg daily

## 2017-12-19 NOTE — Assessment & Plan Note (Signed)
Stable con't lexapro   

## 2017-12-19 NOTE — Patient Instructions (Signed)
DASH Eating Plan DASH stands for "Dietary Approaches to Stop Hypertension." The DASH eating plan is a healthy eating plan that has been shown to reduce high blood pressure (hypertension). It may also reduce your risk for type 2 diabetes, heart disease, and stroke. The DASH eating plan may also help with weight loss. What are tips for following this plan? General guidelines  Avoid eating more than 2,300 mg (milligrams) of salt (sodium) a day. If you have hypertension, you may need to reduce your sodium intake to 1,500 mg a day.  Limit alcohol intake to no more than 1 drink a day for nonpregnant women and 2 drinks a day for men. One drink equals 12 oz of beer, 5 oz of wine, or 1 oz of hard liquor.  Work with your health care provider to maintain a healthy body weight or to lose weight. Ask what an ideal weight is for you.  Get at least 30 minutes of exercise that causes your heart to beat faster (aerobic exercise) most days of the week. Activities may include walking, swimming, or biking.  Work with your health care provider or diet and nutrition specialist (dietitian) to adjust your eating plan to your individual calorie needs. Reading food labels  Check food labels for the amount of sodium per serving. Choose foods with less than 5 percent of the Daily Value of sodium. Generally, foods with less than 300 mg of sodium per serving fit into this eating plan.  To find whole grains, look for the word "whole" as the first word in the ingredient list. Shopping  Buy products labeled as "low-sodium" or "no salt added."  Buy fresh foods. Avoid canned foods and premade or frozen meals. Cooking  Avoid adding salt when cooking. Use salt-free seasonings or herbs instead of table salt or sea salt. Check with your health care provider or pharmacist before using salt substitutes.  Do not fry foods. Cook foods using healthy methods such as baking, boiling, grilling, and broiling instead.  Cook with  heart-healthy oils, such as olive, canola, soybean, or sunflower oil. Meal planning   Eat a balanced diet that includes: ? 5 or more servings of fruits and vegetables each day. At each meal, try to fill half of your plate with fruits and vegetables. ? Up to 6-8 servings of whole grains each day. ? Less than 6 oz of lean meat, poultry, or fish each day. A 3-oz serving of meat is about the same size as a deck of cards. One egg equals 1 oz. ? 2 servings of low-fat dairy each day. ? A serving of nuts, seeds, or beans 5 times each week. ? Heart-healthy fats. Healthy fats called Omega-3 fatty acids are found in foods such as flaxseeds and coldwater fish, like sardines, salmon, and mackerel.  Limit how much you eat of the following: ? Canned or prepackaged foods. ? Food that is high in trans fat, such as fried foods. ? Food that is high in saturated fat, such as fatty meat. ? Sweets, desserts, sugary drinks, and other foods with added sugar. ? Full-fat dairy products.  Do not salt foods before eating.  Try to eat at least 2 vegetarian meals each week.  Eat more home-cooked food and less restaurant, buffet, and fast food.  When eating at a restaurant, ask that your food be prepared with less salt or no salt, if possible. What foods are recommended? The items listed may not be a complete list. Talk with your dietitian about what   dietary choices are best for you. Grains Whole-grain or whole-wheat bread. Whole-grain or whole-wheat pasta. Brown rice. Oatmeal. Quinoa. Bulgur. Whole-grain and low-sodium cereals. Pita bread. Low-fat, low-sodium crackers. Whole-wheat flour tortillas. Vegetables Fresh or frozen vegetables (raw, steamed, roasted, or grilled). Low-sodium or reduced-sodium tomato and vegetable juice. Low-sodium or reduced-sodium tomato sauce and tomato paste. Low-sodium or reduced-sodium canned vegetables. Fruits All fresh, dried, or frozen fruit. Canned fruit in natural juice (without  added sugar). Meat and other protein foods Skinless chicken or turkey. Ground chicken or turkey. Pork with fat trimmed off. Fish and seafood. Egg whites. Dried beans, peas, or lentils. Unsalted nuts, nut butters, and seeds. Unsalted canned beans. Lean cuts of beef with fat trimmed off. Low-sodium, lean deli meat. Dairy Low-fat (1%) or fat-free (skim) milk. Fat-free, low-fat, or reduced-fat cheeses. Nonfat, low-sodium ricotta or cottage cheese. Low-fat or nonfat yogurt. Low-fat, low-sodium cheese. Fats and oils Soft margarine without trans fats. Vegetable oil. Low-fat, reduced-fat, or light mayonnaise and salad dressings (reduced-sodium). Canola, safflower, olive, soybean, and sunflower oils. Avocado. Seasoning and other foods Herbs. Spices. Seasoning mixes without salt. Unsalted popcorn and pretzels. Fat-free sweets. What foods are not recommended? The items listed may not be a complete list. Talk with your dietitian about what dietary choices are best for you. Grains Baked goods made with fat, such as croissants, muffins, or some breads. Dry pasta or rice meal packs. Vegetables Creamed or fried vegetables. Vegetables in a cheese sauce. Regular canned vegetables (not low-sodium or reduced-sodium). Regular canned tomato sauce and paste (not low-sodium or reduced-sodium). Regular tomato and vegetable juice (not low-sodium or reduced-sodium). Pickles. Olives. Fruits Canned fruit in a light or heavy syrup. Fried fruit. Fruit in cream or butter sauce. Meat and other protein foods Fatty cuts of meat. Ribs. Fried meat. Bacon. Sausage. Bologna and other processed lunch meats. Salami. Fatback. Hotdogs. Bratwurst. Salted nuts and seeds. Canned beans with added salt. Canned or smoked fish. Whole eggs or egg yolks. Chicken or turkey with skin. Dairy Whole or 2% milk, cream, and half-and-half. Whole or full-fat cream cheese. Whole-fat or sweetened yogurt. Full-fat cheese. Nondairy creamers. Whipped toppings.  Processed cheese and cheese spreads. Fats and oils Butter. Stick margarine. Lard. Shortening. Ghee. Bacon fat. Tropical oils, such as coconut, palm kernel, or palm oil. Seasoning and other foods Salted popcorn and pretzels. Onion salt, garlic salt, seasoned salt, table salt, and sea salt. Worcestershire sauce. Tartar sauce. Barbecue sauce. Teriyaki sauce. Soy sauce, including reduced-sodium. Steak sauce. Canned and packaged gravies. Fish sauce. Oyster sauce. Cocktail sauce. Horseradish that you find on the shelf. Ketchup. Mustard. Meat flavorings and tenderizers. Bouillon cubes. Hot sauce and Tabasco sauce. Premade or packaged marinades. Premade or packaged taco seasonings. Relishes. Regular salad dressings. Where to find more information:  National Heart, Lung, and Blood Institute: www.nhlbi.nih.gov  American Heart Association: www.heart.org Summary  The DASH eating plan is a healthy eating plan that has been shown to reduce high blood pressure (hypertension). It may also reduce your risk for type 2 diabetes, heart disease, and stroke.  With the DASH eating plan, you should limit salt (sodium) intake to 2,300 mg a day. If you have hypertension, you may need to reduce your sodium intake to 1,500 mg a day.  When on the DASH eating plan, aim to eat more fresh fruits and vegetables, whole grains, lean proteins, low-fat dairy, and heart-healthy fats.  Work with your health care provider or diet and nutrition specialist (dietitian) to adjust your eating plan to your individual   calorie needs. This information is not intended to replace advice given to you by your health care provider. Make sure you discuss any questions you have with your health care provider. Document Released: 11/03/2011 Document Revised: 11/07/2016 Document Reviewed: 11/07/2016 Elsevier Interactive Patient Education  2018 Else[employer] Inc.  

## 2017-12-20 LAB — COMPREHENSIVE METABOLIC PANEL
ALBUMIN: 4.3 g/dL (ref 3.5–5.2)
ALT: 13 U/L (ref 0–35)
AST: 15 U/L (ref 0–37)
Alkaline Phosphatase: 37 U/L — ABNORMAL LOW (ref 39–117)
BUN: 18 mg/dL (ref 6–23)
CO2: 24 mEq/L (ref 19–32)
Calcium: 9.6 mg/dL (ref 8.4–10.5)
Chloride: 100 mEq/L (ref 96–112)
Creatinine, Ser: 0.77 mg/dL (ref 0.40–1.20)
GFR: 87.07 mL/min (ref >60.00)
Glucose, Bld: 102 mg/dL — ABNORMAL HIGH (ref 70–99)
POTASSIUM: 4.4 meq/L (ref 3.5–5.1)
Sodium: 135 mEq/L (ref 135–145)
Total Bilirubin: 0.3 mg/dL (ref 0.2–1.2)
Total Protein: 7 g/dL (ref 6.0–8.3)

## 2017-12-20 LAB — LIPID PANEL
CHOLESTEROL: 164 mg/dL (ref 0–200)
HDL: 66.1 mg/dL (ref >39.00)
LDL CALC: 67 mg/dL (ref 0–99)
NONHDL: 97.69
Total CHOL/HDL Ratio: 2
Triglycerides: 154 mg/dL — ABNORMAL HIGH (ref 0.0–149.0)
VLDL: 30.8 mg/dL (ref 0.0–40.0)

## 2018-01-03 ENCOUNTER — Ambulatory Visit: Payer: 59 | Admitting: Internal Medicine

## 2018-01-03 ENCOUNTER — Encounter: Payer: Self-pay | Admitting: Internal Medicine

## 2018-01-03 VITALS — BP 126/68 | HR 76 | Temp 97.8°F | Resp 14 | Ht 67.0 in | Wt 161.5 lb

## 2018-01-03 DIAGNOSIS — B349 Viral infection, unspecified: Secondary | ICD-10-CM | POA: Diagnosis not present

## 2018-01-03 LAB — POCT INFLUENZA A/B
INFLUENZA A, POC: NEGATIVE
Influenza B, POC: NEGATIVE

## 2018-01-03 MED ORDER — OSELTAMIVIR PHOSPHATE 75 MG PO CAPS: 75.0000 mg | ORAL_CAPSULE | Freq: Two times a day (BID) | ORAL | 0 refills | Status: DC

## 2018-01-03 NOTE — Progress Notes (Signed)
Subjective:    Patient ID: Carolyn Hebert, female    DOB: [birth year], 42 y.o.   MRN: [id number]  DOS:  01/03/2018 Type of visit - description : acute Interval history: She was feeling well until she woke up yesterday with malaise, sinus congestion, yellow nasal discharge. She went to work but she had to return home by 11 AM because she was feeling poorly. She has been resting at home and drinking fluids since. Daughter has some nasal congestion but no apparent viral syndrome.  Review of Systems No fever, some chills. + Cough, + generalized aches.  Some sore throat. No nausea, no vomiting. No headache and no rash Note on BCPs, LMP now.  Past Medical History:  Diagnosis Date  . Abnormal Pap smear   . Anxiety   . Depression   . Dysplastic nevus   . HSV-2 infection   . IBS (irritable bowel syndrome)   . LGSIL (low grade squamous intraepithelial lesion) on Pap smear   . Ovarian cyst     Past Surgical History:  Procedure Laterality Date  . CESAREAN SECTION    . CYSTECTOMY    . LAPAROSCOPY    . SEPTOPLASTY WITH ETHMOIDECTOMY, AND MAXILLARY ANTROSTOMY  [place] , [place]  . WISDOM TOOTH EXTRACTION      Social History   Socioeconomic History  . Marital status: Married    Spouse name: Not on file  . Number of children: 1  . Years of education: Not on file  . Highest education level: Not on file  Social Needs  . Financial resource strain: Not on file  . Food insecurity - worry: Not on file  . Food insecurity - inability: Not on file  . Transportation needs - medical: Not on file  . Transportation needs - non-medical: Not on file  Occupational History  . Occupation: Press photographer  Tobacco Use  . Smoking status: Former Smoker    Years: 7.00    Types: Cigarettes    Last attempt to quit: 11/29/1999    Years since quitting: 18.1  . Smokeless tobacco: Never Used  . Tobacco comment: "couple of cigs on weekend"  Substance and Sexual Activity  . Alcohol use: Yes    Comment:  2-3 beers per week  . Drug use: No  . Sexual activity: Yes    Birth control/protection: Pill  Other Topics Concern  . Not on file  Social History Narrative  . Not on file      Allergies as of 01/03/2018      Reactions   Penicillins    hives      Medication List        Accurate as of 01/03/18 11:59 PM. Always use your most recent med list.          escitalopram 20 MG tablet Commonly known as:  LEXAPRO Take 1 tablet (20 mg total) by mouth daily.   fluticasone 50 MCG/ACT nasal spray Commonly known as:  FLONASE Place 2 sprays into both nostrils daily.   lisinopril 10 MG tablet Commonly known as:  PRINIVIL,ZESTRIL Take 1 tablet (10 mg total) by mouth daily.   NATURE-THROID 113.75 MG Tabs Generic drug:  Thyroid Take 1 tablet by mouth daily.   Olopatadine HCl 0.2 % Soln Commonly known as:  PATADAY Apply 1 drop to eye daily. Both Eyes.   oseltamivir 75 MG capsule Commonly known as:  TAMIFLU Take 1 capsule (75 mg total) by mouth 2 (two) times daily.  Objective:   Physical Exam BP 126/68 (BP Location: Right Arm, Patient Position: Sitting, Cuff Size: Small)   Pulse 76   Temp 97.8 F (36.6 C) (Oral)   Resp 14   Ht 5\' 7"  (1.702 m)   Wt 161 lb 8 oz (73.3 kg)   SpO2 96%   BMI 25.29 kg/m  General:   Well developed, well nourished. NAD.  HEENT:  Normocephalic . Face symmetric, atraumatic. Nose is slightly congested, sinuses mildly TTP at both maxillary areas.  TMs normal.  Throat symmetric, no red, tonsils not enlarged. Lungs:  CTA B Normal respiratory effort, no intercostal retractions, no accessory muscle use. Heart: RRR,  no murmur.  No pretibial edema bilaterally  Skin: Not pale. Not jaundice Neurologic:  alert & oriented X3.  Speech normal, gait appropriate for age and unassisted Psych--  Cognition and judgment appear intact.  Cooperative with normal attention span and concentration.  Behavior appropriate. No anxious or depressed appearing.        Assessment & Plan:   42 year old female with history of HTN, asthma, she also reports history of Lyme disease.  Presents with. Viral syndrome: Although the patient tested negative for influenza and I am still suspicious based on symptoms and recent flu cases in the community. Will treat with Tamiflu, supportive care, see AVS.

## 2018-01-03 NOTE — Progress Notes (Signed)
Pre visit review using our clinic review tool, if applicable. No additional management support is needed unless otherwise documented below in the visit note. 

## 2018-01-03 NOTE — Patient Instructions (Signed)
Rest, fluids , tylenol  For cough:  Take Mucinex DM twice a day as needed until better  For nasal congestion: Use OTC Nasocort or Flonase : 2 nasal sprays on each side of the nose in the morning until you feel better   Start Tamiflu   Call if not gradually better over the next  10 days  Call anytime if the symptoms are severe or if the symptoms resurface after feeling better    Influenza, Adult Influenza, more commonly known as "the flu," is a viral infection that primarily affects the respiratory tract. The respiratory tract includes organs that help you breathe, such as the lungs, nose, and throat. The flu causes many common cold symptoms, as well as a high fever and body aches. The flu spreads easily from person to person (is contagious). Getting a flu shot (influenza vaccination) every year is the best way to prevent influenza. What are the causes? Influenza is caused by a virus. You can catch the virus by:  Breathing in droplets from an infected person's cough or sneeze.  Touching something that was recently contaminated with the virus and then touching your mouth, nose, or eyes.  What increases the risk? The following factors may make you more likely to get the flu:  Not cleaning your hands frequently with soap and water or alcohol-based hand sanitizer.  Having close contact with many people during cold and flu season.  Touching your mouth, eyes, or nose without washing or sanitizing your hands first.  Not drinking enough fluids or not eating a healthy diet.  Not getting enough sleep or exercise.  Being under a high amount of stress.  Not getting a yearly (annual) flu shot.  You may be at a higher risk of complications from the flu, such as a severe lung infection (pneumonia), if you:  Are over the age of 52.  Are pregnant.  Have a weakened disease-fighting system (immune system). You may have a weakened immune system if you: ? Have HIV or AIDS. ? Are  undergoing chemotherapy. ? Aretaking medicines that reduce the activity of (suppress) the immune system.  Have a long-term (chronic) illness, such as heart disease, kidney disease, diabetes, or lung disease.  Have a liver disorder.  Are obese.  Have anemia.  What are the signs or symptoms? Symptoms of this condition typically last 4-10 days and may include:  Fever.  Chills.  Headache, body aches, or muscle aches.  Sore throat.  Cough.  Runny or congested nose.  Chest discomfort and cough.  Poor appetite.  Weakness or tiredness (fatigue).  Dizziness.  Nausea or vomiting.  How is this diagnosed? This condition may be diagnosed based on your medical history and a physical exam. Your health care provider may do a nose or throat swab test to confirm the diagnosis. How is this treated? If influenza is detected early, you can be treated with antiviral medicine that can reduce the length of your illness and the severity of your symptoms. This medicine may be given by mouth (orally) or through an IV tube that is inserted in one of your veins. The goal of treatment is to relieve symptoms by taking care of yourself at home. This may include taking over-the-counter medicines, drinking plenty of fluids, and adding humidity to the air in your home. In some cases, influenza goes away on its own. Severe influenza or complications from influenza may be treated in a hospital. Follow these instructions at home:  Take over-the-counter and prescription medicines  only as told by your health care provider.  Use a cool mist humidifier to add humidity to the air in your home. This can make breathing easier.  Rest as needed.  Drink enough fluid to keep your urine clear or pale yellow.  Cover your mouth and nose when you cough or sneeze.  Wash your hands with soap and water often, especially after you cough or sneeze. If soap and water are not available, use hand sanitizer.  Stay home  from work or school as told by your health care provider. Unless you are visiting your health care provider, try to avoid leaving home until your fever has been gone for 24 hours without the use of medicine.  Keep all follow-up visits as told by your health care provider. This is important. How is this prevented?  Getting an annual flu shot is the best way to avoid getting the flu. You may get the flu shot in late summer, fall, or winter. Ask your health care provider when you should get your flu shot.  Wash your hands often or use hand sanitizer often.  Avoid contact with people who are sick during cold and flu season.  Eat a healthy diet, drink plenty of fluids, get enough sleep, and exercise regularly. Contact a health care provider if:  You develop new symptoms.  You have: ? Chest pain. ? Diarrhea. ? A fever.  Your cough gets worse.  You produce more mucus.  You feel nauseous or you vomit. Get help right away if:  You develop shortness of breath or difficulty breathing.  Your skin or nails turn a bluish color.  You have severe pain or stiffness in your neck.  You develop a sudden headache or sudden pain in your face or ear.  You cannot stop vomiting. This information is not intended to replace advice given to you by your health care provider. Make sure you discuss any questions you have with your health care provider. Document Released: 11/11/2000 Document Revised: 04/21/2016 Document Reviewed: 09/08/2015 Elsevier Interactive Patient Education  2017 [employer].

## 2018-01-31 ENCOUNTER — Encounter: Payer: Self-pay | Admitting: Family Medicine

## 2018-01-31 ENCOUNTER — Ambulatory Visit: Payer: 59 | Admitting: Family Medicine

## 2018-01-31 VITALS — BP 118/80 | HR 78 | Temp 98.0°F | Ht 67.0 in | Wt 164.0 lb

## 2018-01-31 DIAGNOSIS — R0789 Other chest pain: Secondary | ICD-10-CM | POA: Diagnosis not present

## 2018-01-31 DIAGNOSIS — A6920 Lyme disease, unspecified: Secondary | ICD-10-CM

## 2018-01-31 DIAGNOSIS — M791 Myalgia, unspecified site: Secondary | ICD-10-CM | POA: Diagnosis not present

## 2018-01-31 NOTE — Progress Notes (Signed)
Pre visit review using our clinic review tool, if applicable. No additional management support is needed unless otherwise documented below in the visit note. 

## 2018-01-31 NOTE — Patient Instructions (Addendum)
Get in touch with your ID provider. If there is no good explanation for your issue, let me know and I will put in some labs.  I do not think you need an X-ray of your chest based on your exam and symptoms.  Your EKG looks good.  Ibuprofen 400-600 mg (2-3 over the counter strength tabs) every 6 hours as needed for pain.  OK to take Tylenol 1000 mg (2 extra strength tabs) or 975 mg (3 regular strength tabs) every 6 hours as needed.  Push lots of fluids, try to stay as active as possible.  Let us know if you need anything.

## 2018-01-31 NOTE — Progress Notes (Signed)
Chief Complaint  Patient presents with  . Generalized Body Aches  . Fatigue    Subjective: Patient is a 42 y.o. female here for continued myalgias and joint pain.   She has a hx of dormant Lyme Dz, follows with ID. She was tx'd for influenza and got better, but has progressively worsened with similar symptoms over the past 2 weeks. She has had a constant ache in her chest and some nighttime coughing. She denies fevers, rashes, swelling or other URI s/s's. She would like to make sure it is not PNA. No personal or famhx of autoimmune issues. She has been using Aleve with some relief at home.    ROS: Heart: + chest pain  Lungs: Denies SOB   Family History  Problem Relation Age of Onset  . Cancer Maternal Aunt   . Cancer Maternal Grandfather        prostate/lung  . Hypertension Father   . Hyperlipidemia Mother   . Cancer Maternal Uncle        melanoma  . Deep vein thrombosis Maternal Grandmother    Past Medical History:  Diagnosis Date  . Abnormal Pap smear   . Anxiety   . Depression   . Dysplastic nevus   . HSV-2 infection   . IBS (irritable bowel syndrome)   . LGSIL (low grade squamous intraepithelial lesion) on Pap smear   . Lyme disease   . Ovarian cyst       Objective: BP 118/80 (BP Location: Left Arm, Patient Position: Sitting, Cuff Size: Normal)   Pulse 78   Temp 98 F (36.7 C) (Oral)   Ht '5\' 7"'  (1.702 m)   Wt 164 lb (74.4 kg)   SpO2 97%   BMI 25.69 kg/m  General: Awake, appears stated age HEENT: MMM, EOMi, PERLLA, ears neg b/l Heart: RRR, no murmurs, no LE edema Lungs: CTAB, no rales, wheezes or rhonchi. No accessory muscle use Psych: Age appropriate judgment and insight, normal affect and mood  Assessment and Plan: Myalgia  Atypical chest pain - Plan: EKG 12-Lead  Lyme disease, unspecified  Orders as above. Little yield for CXR, could get labs for possible infection related myositis. EKG neg.  Cont supportive care and f/u with ID team.  I did  offer labs, she will take me up on offer if ID team does not have a satisfactory explanation (though it sounds like they do). Will order ESR, CRP, CBC as preliminary to autoimmune workup.  The patient voiced understanding and agreement to the plan.  [place], DO 01/31/18  9:00 AM

## 2018-02-01 DIAGNOSIS — R5383 Other fatigue: Secondary | ICD-10-CM | POA: Diagnosis not present

## 2018-02-01 DIAGNOSIS — E039 Hypothyroidism, unspecified: Secondary | ICD-10-CM | POA: Diagnosis not present

## 2018-02-16 ENCOUNTER — Encounter: Payer: Self-pay | Admitting: Nurse Practitioner

## 2018-02-16 ENCOUNTER — Ambulatory Visit: Payer: 59 | Admitting: Nurse Practitioner

## 2018-02-16 ENCOUNTER — Other Ambulatory Visit (HOSPITAL_COMMUNITY)
Admission: RE | Admit: 2018-02-16 | Discharge: 2018-02-16 | Disposition: A | Discharge status: Home / Self Care | Payer: 59 | Source: Ambulatory Visit | Attending: Nurse Practitioner | Admitting: Nurse Practitioner

## 2018-02-16 VITALS — BP 126/78 | HR 72 | Temp 97.6°F | Ht 67.0 in | Wt 164.0 lb

## 2018-02-16 DIAGNOSIS — R300 Dysuria: Secondary | ICD-10-CM | POA: Diagnosis not present

## 2018-02-16 DIAGNOSIS — R350 Frequency of micturition: Secondary | ICD-10-CM

## 2018-02-16 DIAGNOSIS — Z23 Encounter for immunization: Secondary | ICD-10-CM

## 2018-02-16 LAB — POCT URINALYSIS DIPSTICK
BILIRUBIN UA: NEGATIVE
Blood, UA: NEGATIVE
Glucose, UA: NEGATIVE
KETONES UA: NEGATIVE
Leukocytes, UA: NEGATIVE
NITRITE UA: NEGATIVE
PH UA: 6 (ref 5.0–8.0)
Protein, UA: NEGATIVE
SPEC GRAV UA: 1.01 (ref 1.010–1.025)
UROBILINOGEN UA: 0.2 U/dL

## 2018-02-16 MED ORDER — PHENAZOPYRIDINE HCL 100 MG PO TABS
100.0000 mg | ORAL_TABLET | Freq: Three times a day (TID) | ORAL | 0 refills | Status: DC | PRN
Start: 2018-02-16 — End: 2018-05-30

## 2018-02-16 MED ORDER — NITROFURANTOIN MONOHYD MACRO 100 MG PO CAPS: 100.0000 mg | ORAL_CAPSULE | Freq: Two times a day (BID) | ORAL | 0 refills | Status: AC

## 2018-02-16 NOTE — Patient Instructions (Signed)
You urine was sent for culture and cytology. You will be called with results.  Continue to push oral hydration.  Take oral abx with food.

## 2018-02-16 NOTE — Progress Notes (Signed)
Subjective:  Patient ID: Carolyn Hebert, female    DOB: 1976/10/17  Age: 42 y.o. MRN: [id number]  CC: Urinary Tract Infection (burning and frequent urinate/1 wk) and Laceration (thumb got cut/just want to look at)   Urinary Tract Infection   This is a new problem. The current episode started in the past 7 days. The problem occurs every urination. The problem has been gradually worsening. The quality of the pain is described as burning. There has been no fever. She is sexually active. There is no history of pyelonephritis. Associated symptoms include a discharge and frequency. Pertinent negatives include no chills, flank pain, hematuria, hesitancy, nausea, possible pregnancy, sweats, urgency or vomiting. She has tried increased fluids for the symptoms. The treatment provided mild relief.  Laceration   The incident occurred 12 to 24 hours ago. Pain location: left finger. The laceration is 1 cm in size. The laceration mechanism was a clean knife. The patient is experiencing no pain. She reports no foreign bodies present. Carolyn Hebert tetanus status is out of date.   Sexually active with new partner. No condom use. Partner with vasectomy  Outpatient Medications Prior to Visit  Medication Sig Dispense Refill  . escitalopram (LEXAPRO) 20 MG tablet Take 1 tablet (20 mg total) by mouth daily. 90 tablet 3  . fluticasone (FLONASE) 50 MCG/ACT nasal spray Place 2 sprays into both nostrils daily. 16 g 2  . lisinopril (PRINIVIL,ZESTRIL) 10 MG tablet Take 1 tablet (10 mg total) by mouth daily. 30 tablet 3  . Olopatadine HCl (PATADAY) 0.2 % SOLN Apply 1 drop to eye daily. Both Eyes. 5 mL 5  . Thyroid (NATURE-THROID) 113.75 MG TABS Take 1 tablet by mouth daily.     No facility-administered medications prior to visit.     ROS See HPI  Objective:  BP 126/78   Pulse 72   Temp 97.6 F (36.4 C) (Oral)   Ht 5\' 7"  (1.702 m)   Wt 164 lb (74.4 kg)   SpO2 99%   BMI 25.69 kg/m   BP Readings from Last 3  Encounters:  02/16/18 126/78  01/31/18 118/80  01/03/18 126/68    Wt Readings from Last 3 Encounters:  02/16/18 164 lb (74.4 kg)  01/31/18 164 lb (74.4 kg)  01/03/18 161 lb 8 oz (73.3 kg)    Physical Exam  Constitutional: She is oriented to person, place, and time. No distress.  Cardiovascular: Normal rate.  Pulmonary/Chest: Effort normal.  Abdominal: Soft. Bowel sounds are normal. She exhibits no distension and no mass. There is tenderness. There is no rebound and no guarding.  Neurological: She is alert and oriented to person, place, and time.  Psychiatric: She has a normal mood and affect. Carolyn Hebert behavior is normal.  Vitals reviewed.   Lab Results  Component Value Date   WBC 8.0 02/25/2014   HGB 13.9 02/25/2014   HCT 40.5 02/25/2014   PLT 269.0 02/25/2014   GLUCOSE 102 (H) 12/19/2017   CHOL 164 12/19/2017   TRIG 154.0 (H) 12/19/2017   HDL 66.10 12/19/2017   LDLCALC 67 12/19/2017   ALT 13 12/19/2017   AST 15 12/19/2017   NA 135 12/19/2017   K 4.4 12/19/2017   CL 100 12/19/2017   CREATININE 0.77 12/19/2017   BUN 18 12/19/2017   CO2 24 12/19/2017   TSH 1.70 04/30/2013   MICROALBUR 0.2 04/30/2013    No results found.  Assessment & Plan:   Carolyn Hebert was seen today for urinary tract infection and laceration.  Diagnoses and all orders for this visit:  Frequent urination -     POCT urinalysis dipstick -     Urine Culture -     Urine cytology ancillary only -     nitrofurantoin, macrocrystal-monohydrate, (MACROBID) 100 MG capsule; Take 1 capsule (100 mg total) by mouth 2 (two) times daily for 3 days. -     phenazopyridine (PYRIDIUM) 100 MG tablet; Take 1 tablet (100 mg total) by mouth 3 (three) times daily as needed for pain.  Dysuria -     POCT urinalysis dipstick -     Urine Culture -     Urine cytology ancillary only -     nitrofurantoin, macrocrystal-monohydrate, (MACROBID) 100 MG capsule; Take 1 capsule (100 mg total) by mouth 2 (two) times daily for 3 days. -      phenazopyridine (PYRIDIUM) 100 MG tablet; Take 1 tablet (100 mg total) by mouth 3 (three) times daily as needed for pain.   I am having Carolyn Hebert start on nitrofurantoin (macrocrystal-monohydrate) and phenazopyridine. I am also having Carolyn Hebert maintain Carolyn Hebert Thyroid, fluticasone, Olopatadine HCl, lisinopril, and escitalopram.  Meds ordered this encounter  Medications  . nitrofurantoin, macrocrystal-monohydrate, (MACROBID) 100 MG capsule    Sig: Take 1 capsule (100 mg total) by mouth 2 (two) times daily for 3 days.    Dispense:  6 capsule    Refill:  0    Order Specific Question:   Supervising Provider    Answer:   Lucille Passy [[id number]]  . phenazopyridine (PYRIDIUM) 100 MG tablet    Sig: Take 1 tablet (100 mg total) by mouth 3 (three) times daily as needed for pain.    Dispense:  10 tablet    Refill:  0    Order Specific Question:   Supervising Provider    Answer:   Lucille Passy [[id number]]    Follow-up: No follow-ups on file.  Wilfred Lacy, NP

## 2018-02-17 LAB — URINE CULTURE
MICRO NUMBER: 90363130
SPECIMEN QUALITY:: ADEQUATE

## 2018-02-19 ENCOUNTER — Encounter: Payer: Self-pay | Admitting: Nurse Practitioner

## 2018-02-20 LAB — URINE CYTOLOGY ANCILLARY ONLY
Chlamydia: NEGATIVE
Neisseria Gonorrhea: NEGATIVE
Trichomonas: NEGATIVE

## 2018-02-23 LAB — URINE CYTOLOGY ANCILLARY ONLY
Bacterial vaginitis: NEGATIVE
Candida vaginitis: NEGATIVE

## 2018-04-12 DIAGNOSIS — Z86018 Personal history of other benign neoplasm: Secondary | ICD-10-CM | POA: Diagnosis not present

## 2018-04-12 DIAGNOSIS — Z808 Family history of malignant neoplasm of other organs or systems: Secondary | ICD-10-CM | POA: Diagnosis not present

## 2018-04-12 DIAGNOSIS — D225 Melanocytic nevi of trunk: Secondary | ICD-10-CM | POA: Diagnosis not present

## 2018-05-02 ENCOUNTER — Ambulatory Visit: Payer: 59 | Admitting: Family Medicine

## 2018-05-02 ENCOUNTER — Encounter: Payer: Self-pay | Admitting: Family Medicine

## 2018-05-02 VITALS — BP 112/78 | HR 88 | Temp 98.6°F | Ht 67.0 in | Wt 163.2 lb

## 2018-05-02 DIAGNOSIS — J0100 Acute maxillary sinusitis, unspecified: Secondary | ICD-10-CM

## 2018-05-02 MED ORDER — DOXYCYCLINE HYCLATE 100 MG PO TABS: 100.0000 mg | ORAL_TABLET | Freq: Two times a day (BID) | ORAL | 0 refills | Status: DC

## 2018-05-02 NOTE — Progress Notes (Signed)
Pre visit review using our clinic review tool, if applicable. No additional management support is needed unless otherwise documented below in the visit note. 

## 2018-05-02 NOTE — Patient Instructions (Addendum)
Continue to push fluids, practice good hand hygiene, and cover your mouth if you cough.  If you start having fevers, shaking or shortness of breath, seek immediate care.  For symptoms, consider using Vick's VapoRub on chest or under nose, air humidifier, Benadryl at night, and elevating the head of the bed. Tylenol and ibuprofen for aches and pains you may be experiencing.   Let us know if you need anything.  

## 2018-05-02 NOTE — Progress Notes (Signed)
Chief Complaint  Patient presents with  . Cough    chest congestion  . Sinus Problem    Conception Chancy here for URI complaints.  Duration: 5 days  Associated symptoms: sinus congestion, sinus pain, rhinorrhea, sore throat, wheezing, shortness of breath, myalgia and cough Denies: itchy watery eyes, ear pain, ear drainage, fever and chest pain Treatment to date: Immune booster, tea,  Sick contacts: Yes  ROS:  Const: Denies fevers HEENT: As noted in HPI Lungs: No SOB  Past Medical History:  Diagnosis Date  . Abnormal Pap smear   . Anxiety   . Depression   . Dysplastic nevus   . HSV-2 infection   . IBS (irritable bowel syndrome)   . LGSIL (low grade squamous intraepithelial lesion) on Pap smear   . Lyme disease   . Ovarian cyst    BP 112/78 (BP Location: Left Arm, Patient Position: Sitting, Cuff Size: Normal)   Pulse 88   Temp 98.6 F (37 C) (Oral)   Ht 5\' 7"  (1.702 m)   Wt 163 lb 4 oz (74 kg)   SpO2 97%   BMI 25.57 kg/m  General: Awake, alert, appears stated age HEENT: AT, [place], ears patent b/l and TM's neg, +max ttp b/l; nares patent w/o discharge, pharynx pink and without exudates, MMM Neck: No masses or asymmetry Heart: RRR Lungs: CTAB, no accessory muscle use Psych: Age appropriate judgment and insight, normal mood and affect  Acute non-recurrent maxillary sinusitis - Plan: doxycycline (VIBRA-TABS) 100 MG tablet  Orders as above. Continue to push fluids, practice good hand hygiene, cover mouth when coughing. F/u prn. If starting to experience fevers, shaking, or shortness of breath, seek immediate care. Pt voiced understanding and agreement to the plan.  Arnold Line, DO 05/02/18 2:32 PM

## 2018-05-03 ENCOUNTER — Telehealth: Payer: Self-pay | Admitting: Family Medicine

## 2018-05-03 ENCOUNTER — Encounter: Payer: Self-pay | Admitting: Family Medicine

## 2018-05-03 ENCOUNTER — Other Ambulatory Visit: Payer: Self-pay | Admitting: Family Medicine

## 2018-05-03 DIAGNOSIS — R05 Cough: Secondary | ICD-10-CM

## 2018-05-03 DIAGNOSIS — R059 Cough, unspecified: Secondary | ICD-10-CM

## 2018-05-03 MED ORDER — PROMETHAZINE-DM 6.25-15 MG/5ML PO SYRP: 5.0000 mL | ORAL_SOLUTION | Freq: Four times a day (QID) | ORAL | 0 refills | Status: DC | PRN

## 2018-05-03 NOTE — Telephone Encounter (Signed)
Copied from [place] [id number]. Topic: General - Other >> May 03, 2018 11:39 AM Cecelia Byars, NT wrote: Reason for CRM: Patient was seen yesterday and was told to call if she needed a prescription, she  for cough syrup which she is requesting  at this time . please call her at [phone]

## 2018-05-03 NOTE — Telephone Encounter (Signed)
Phenergan dm sent

## 2018-05-04 NOTE — Telephone Encounter (Signed)
Left detailed message on machine that cough syrup was sent in.

## 2018-05-16 ENCOUNTER — Encounter: Payer: Self-pay | Admitting: Medical

## 2018-05-16 ENCOUNTER — Ambulatory Visit: Payer: 59 | Admitting: Medical

## 2018-05-16 VITALS — BP 110/90 | HR 70 | Temp 98.5°F | Resp 14 | Ht 67.0 in | Wt 162.0 lb

## 2018-05-16 DIAGNOSIS — J029 Acute pharyngitis, unspecified: Secondary | ICD-10-CM

## 2018-05-16 LAB — POCT RAPID STREP A (OFFICE): Rapid Strep A Screen: NEGATIVE

## 2018-05-16 MED ORDER — AZITHROMYCIN 250 MG PO TABS: ORAL_TABLET | ORAL | 0 refills | Status: DC

## 2018-05-16 NOTE — Patient Instructions (Addendum)
Your strep test was negative. However, your physical exam and clinical presentation is suspicious for strep and it is important to note that rapid strep test can be falsely negative. So I am going to give you azithromycin antibiotic today based on your exam and clinical presentation.  Can use tylenol or ibuprofen for muscle aches if needed.  If signs or symptoms persist despite above measures please notify us.  Follow up in 7 days or as needed.

## 2018-05-16 NOTE — Progress Notes (Signed)
Subjective:    Patient ID: Carolyn Hebert, female    DOB: 04-Mar-1976, 42 y.o.   MRN: [id number]  HPI  Pt in with severe throat.   Pain since Monday late afternoon. Body aches and fatigue. Some ha as well. Some intemitient upset stomach. No vomiting. No diarrhea.  States fatigue is severe.  Last May 19, 2018 has strep. She states feels like strep.    Review of Systems  Constitutional: Positive for fatigue. Negative for chills and fever.  HENT: Positive for sore throat. Negative for congestion, sinus pressure and sinus pain.   Respiratory: Negative for cough, chest tightness and wheezing.   Cardiovascular: Negative for chest pain and palpitations.  Gastrointestinal: Negative for abdominal pain.  Musculoskeletal: Positive for myalgias. Negative for arthralgias, back pain and neck stiffness.  Skin: Negative for rash.  Neurological: Negative for dizziness.  Hematological: Positive for adenopathy.  Psychiatric/Behavioral: Negative for behavioral problems and confusion.   Past Medical History:  Diagnosis Date  . Abnormal Pap smear   . Anxiety   . Depression   . Dysplastic nevus   . HSV-2 infection   . IBS (irritable bowel syndrome)   . LGSIL (low grade squamous intraepithelial lesion) on Pap smear   . Lyme disease   . Ovarian cyst      Social History   Socioeconomic History  . Marital status: Divorced    Spouse name: Not on file  . Number of children: 1  . Years of education: Not on file  . Highest education level: Not on file  Occupational History  . Occupation: Geographical information systems officer  . Financial resource strain: Not on file  . Food insecurity:    Worry: Not on file    Inability: Not on file  . Transportation needs:    Medical: Not on file    Non-medical: Not on file  Tobacco Use  . Smoking status: Former Smoker    Years: 7.00    Types: Cigarettes    Last attempt to quit: 11/29/1999    Years since quitting: 18.4  . Smokeless tobacco: Never Used  .  Tobacco comment: "couple of cigs on weekend"  Substance and Sexual Activity  . Alcohol use: Yes    Comment: 2-3 beers per week  . Drug use: No  . Sexual activity: Yes    Birth control/protection: Pill  Lifestyle  . Physical activity:    Days per week: Not on file    Minutes per session: Not on file  . Stress: Not on file  Relationships  . Social connections:    Talks on phone: Not on file    Gets together: Not on file    Attends religious service: Not on file    Active member of club or organization: Not on file    Attends meetings of clubs or organizations: Not on file    Relationship status: Not on file  . Intimate partner violence:    Fear of current or ex partner: Not on file    Emotionally abused: Not on file    Physically abused: Not on file    Forced sexual activity: Not on file  Other Topics Concern  . Not on file  Social History Narrative  . Not on file    Past Surgical History:  Procedure Laterality Date  . CESAREAN SECTION    . CYSTECTOMY    . LAPAROSCOPY    . SEPTOPLASTY WITH ETHMOIDECTOMY, AND MAXILLARY ANTROSTOMY  2011    [place] ,  [place]  . WISDOM TOOTH EXTRACTION      Family History  Problem Relation Age of Onset  . Cancer Maternal Aunt   . Cancer Maternal Grandfather        prostate/lung  . Hypertension Father   . Hyperlipidemia Mother   . Cancer Maternal Uncle        melanoma  . Deep vein thrombosis Maternal Grandmother     Allergies  Allergen Reactions  . Penicillins     hives  . Gluten Meal   . Lactose Intolerance (Gi)     Current Outpatient Medications on File Prior to Visit  Medication Sig Dispense Refill  . escitalopram (LEXAPRO) 20 MG tablet Take 1 tablet (20 mg total) by mouth daily. 90 tablet 3  . fluticasone (FLONASE) 50 MCG/ACT nasal spray Place 2 sprays into both nostrils daily. 16 g 2  . Olopatadine HCl (PATADAY) 0.2 % SOLN Apply 1 drop to eye daily. Both Eyes. 5 mL 5  . Thyroid (NATURE-THROID) 113.75 MG TABS Take 1 tablet  by mouth daily.    Marland Kitchen doxycycline (VIBRA-TABS) 100 MG tablet Take 1 tablet (100 mg total) by mouth 2 (two) times daily. (Patient not taking: Reported on 05/16/2018) 14 tablet 0  . lisinopril (PRINIVIL,ZESTRIL) 10 MG tablet Take 1 tablet (10 mg total) by mouth daily. (Patient not taking: Reported on 05/16/2018) 30 tablet 3  . phenazopyridine (PYRIDIUM) 100 MG tablet Take 1 tablet (100 mg total) by mouth 3 (three) times daily as needed for pain. (Patient not taking: Reported on 05/16/2018) 10 tablet 0  . promethazine-dextromethorphan (PROMETHAZINE-DM) 6.25-15 MG/5ML syrup Take 5 mLs by mouth 4 (four) times daily as needed. (Patient not taking: Reported on 05/16/2018) 118 mL 0   No current facility-administered medications on file prior to visit.     BP 110/90 (BP Location: Left Arm, Patient Position: Sitting, Cuff Size: Normal)   Pulse 70   Temp 98.5 F (36.9 C)   Resp 14   Ht 5\' 7"  (1.702 m)   Wt 162 lb (73.5 kg)   SpO2 98%   BMI 25.37 kg/m       Objective:   Physical Exam  General  Mental Status - Alert. General Appearance - Well groomed. Not in acute distress.  Skin Rashes- No Rashes.  HEENT Head- Normal. Ear Auditory Canal - Left- Normal. Right - Normal.Tympanic Membrane- Left- Normal. Right- Normal. Eye Sclera/Conjunctiva- Left- Normal. Right- Normal. Nose & Sinuses Nasal Mucosa- Left-  Boggy and Congested. Right-  Boggy and  Congested.Bilateral maxillary and frontal sinus pressure. Mouth & Throat Lips: Upper Lip- Normal: no dryness, cracking, pallor, cyanosis, or vesicular eruption. Lower Lip-Normal: no dryness, cracking, pallor, cyanosis or vesicular eruption. Buccal Mucosa- Bilateral- No Aphthous ulcers. Oropharynx- No Discharge or Erythema. Tonsils: Characteristics- Bilateral- moderate Erythema  Size/Enlargement- Bilateral- 1+ enlargement. Discharge- none seen presently. But dc reported by patient recently.  Neck Neck- Supple. No Masses. Left submandibulare mild  enlarged.   Chest and Lung Exam Auscultation: Breath Sounds:-Clear even and unlabored.  Cardiovascular Auscultation:Rythm- Regular, rate and rhythm. Murmurs & Other Heart Sounds:Ausculatation of the heart reveal- No Murmurs.  Lymphatic Head & Neck General Head & Neck Lymphatics: Bilateral: Description- No Localized lymphadenopathy.      Assessment & Plan:  Your strep test was negative. However, your physical exam and clinical presentation is suspicious for strep and it is important to note that rapid strep test can be falsely negative. So I am going to give you azithromycin antibiotic today based on  your exam and clinical presentation.  Can use tylenol or ibuprofen for muscle aches if needed.  If signs or symptoms persist despite above measures please notify us.  Follow up in 7 days or as needed.   Mackie Pai, PA-C

## 2018-05-21 ENCOUNTER — Telehealth: Payer: Self-pay | Admitting: Family Medicine

## 2018-05-21 NOTE — Telephone Encounter (Signed)
Copied from [place] [id number] [phone]. Topic: Quick Communication - See Telephone Encounter >> May 21, 2018  2:04 PM Conception Chancy, NT wrote: CRM for notification. See Telephone encounter for: 05/21/18.  Patient is calling and states she was seen on 05/16/18 for a sore throat. She states she just finished the z-pack yesterday 05/20/18. She is still not feeling well, still has a white patch on throat as well as sore. Wondering if something else needs to be called in? Please advise.   [facility] [place] [place] [employer] [place] [address] Phone: [phone] Fax: [phone]

## 2018-05-21 NOTE — Telephone Encounter (Signed)
Attempted to reach pt by phone and left message for pt to check mychart acct. Message sent. [place] for [facility] / triage to discuss with pt and schedule appt.

## 2018-05-21 NOTE — Telephone Encounter (Signed)
Let's schedule her for follow up tomorrow.

## 2018-05-23 ENCOUNTER — Encounter: Payer: Self-pay | Admitting: Family Medicine

## 2018-05-23 ENCOUNTER — Ambulatory Visit: Payer: 59 | Admitting: Family Medicine

## 2018-05-23 VITALS — BP 130/90 | HR 74 | Temp 98.4°F | Ht 67.0 in | Wt 165.0 lb

## 2018-05-23 DIAGNOSIS — J029 Acute pharyngitis, unspecified: Secondary | ICD-10-CM | POA: Insufficient documentation

## 2018-05-23 LAB — POCT RAPID STREP A (OFFICE): RAPID STREP A SCREEN: NEGATIVE

## 2018-05-23 NOTE — Progress Notes (Signed)
Subjective:  Patient ID: Carolyn Hebert, female    DOB: [birth year]  Age: 42 y.o. MRN: [id number]  CC: Sore Throat (sore throat,white patch,swollen gland,fatigue and headache/ saw Edwar last week--neg for strep--finish abx--not better)   HPI Carolyn Hebert presents for follow-up of the sore throat that was treated presumptively for strep with Zithromax 5 days ago.  Patient sore throat has not improved.  She is running subjective fevers.  She has had a headache.  There is mild cough.  She has not had postnasal drip.  She has had nausea.  She has had fatigue.  She has been diagnosed with Lyme's disease and reminds me that that syndrome can elevate her Epstein-Barr antibodies.  She is allergic to penicillin.  Outpatient Medications Prior to Visit  Medication Sig Dispense Refill  . escitalopram (LEXAPRO) 20 MG tablet Take 1 tablet (20 mg total) by mouth daily. 90 tablet 3  . fluticasone (FLONASE) 50 MCG/ACT nasal spray Place 2 sprays into both nostrils daily. 16 g 2  . Olopatadine HCl (PATADAY) 0.2 % SOLN Apply 1 drop to eye daily. Both Eyes. 5 mL 5  . Thyroid (NATURE-THROID) 113.75 MG TABS Take 1 tablet by mouth daily.    Marland Kitchen azithromycin (ZITHROMAX) 250 MG tablet Take 2 tablets by mouth on day 1, followed by 1 tablet by mouth daily for 4 days. (Patient not taking: Reported on 05/23/2018) 6 tablet 0  . doxycycline (VIBRA-TABS) 100 MG tablet Take 1 tablet (100 mg total) by mouth 2 (two) times daily. (Patient not taking: Reported on 05/16/2018) 14 tablet 0  . lisinopril (PRINIVIL,ZESTRIL) 10 MG tablet Take 1 tablet (10 mg total) by mouth daily. (Patient not taking: Reported on 05/16/2018) 30 tablet 3  . phenazopyridine (PYRIDIUM) 100 MG tablet Take 1 tablet (100 mg total) by mouth 3 (three) times daily as needed for pain. (Patient not taking: Reported on 05/16/2018) 10 tablet 0  . promethazine-dextromethorphan (PROMETHAZINE-DM) 6.25-15 MG/5ML syrup Take 5 mLs by mouth 4 (four) times daily as  needed. (Patient not taking: Reported on 05/16/2018) 118 mL 0   No facility-administered medications prior to visit.     ROS Review of Systems  Constitutional: Positive for fatigue and fever. Negative for chills and unexpected weight change.  HENT: Positive for sore throat. Negative for congestion, postnasal drip, sinus pressure, sinus pain, trouble swallowing and voice change.   Eyes: Negative.   Respiratory: Positive for cough. Negative for shortness of breath and wheezing.   Cardiovascular: Negative.   Gastrointestinal: Positive for nausea. Negative for abdominal pain and vomiting.  Musculoskeletal: Negative for arthralgias and myalgias.  Skin: Negative for rash.  Neurological: Positive for headaches.  Hematological: Positive for adenopathy.  Psychiatric/Behavioral: Negative.     Objective:  BP 130/90   Pulse 74   Temp 98.4 F (36.9 C) (Oral)   Ht 5\' 7"  (1.702 m)   Wt 165 lb (74.8 kg)   SpO2 98%   BMI 25.84 kg/m   BP Readings from Last 3 Encounters:  05/23/18 130/90  05/16/18 110/90  05/02/18 112/78    Wt Readings from Last 3 Encounters:  05/23/18 165 lb (74.8 kg)  05/16/18 162 lb (73.5 kg)  05/02/18 163 lb 4 oz (74 kg)    Physical Exam  Constitutional: She is oriented to person, place, and time. She appears well-developed and well-nourished.  Non-toxic appearance. She does not appear ill. No distress.  HENT:  Head: Normocephalic and atraumatic.  Right Ear: Hearing, tympanic membrane and ear canal  normal.  Left Ear: Hearing, tympanic membrane and ear canal normal.  Mouth/Throat: Uvula is midline and mucous membranes are normal. Posterior oropharyngeal erythema present. No oropharyngeal exudate, posterior oropharyngeal edema or tonsillar abscesses.  Eyes: Pupils are equal, round, and reactive to light. EOM are normal.  Neck: Neck supple.  Cardiovascular: Normal rate, regular rhythm and normal heart sounds.  Pulmonary/Chest: Effort normal and breath sounds normal.   Lymphadenopathy:    She has cervical adenopathy.  Neurological: She is alert and oriented to person, place, and time.  Skin: Skin is warm and dry.  Psychiatric: She has a normal mood and affect. Her behavior is normal.    Lab Results  Component Value Date   WBC 8.0 02/25/2014   HGB 13.9 02/25/2014   HCT 40.5 02/25/2014   PLT 269.0 02/25/2014   GLUCOSE 102 (H) 12/19/2017   CHOL 164 12/19/2017   TRIG 154.0 (H) 12/19/2017   HDL 66.10 12/19/2017   LDLCALC 67 12/19/2017   ALT 13 12/19/2017   AST 15 12/19/2017   NA 135 12/19/2017   K 4.4 12/19/2017   CL 100 12/19/2017   CREATININE 0.77 12/19/2017   BUN 18 12/19/2017   CO2 24 12/19/2017   TSH 1.70 04/30/2013   MICROALBUR 0.2 04/30/2013    No results found.  Assessment & Plan:   Carolyn Hebert was seen today for sore throat.  Diagnoses and all orders for this visit:  Sore throat -     POCT rapid strep A  Pharyngitis, unspecified etiology -     Epstein-Barr virus VCA, IgM -     CMV IgM -     CBC w/Diff -     Culture, Group A Strep   I am having Carolyn Hebert maintain her Thyroid, fluticasone, Olopatadine HCl, lisinopril, escitalopram, phenazopyridine, doxycycline, promethazine-dextromethorphan, and azithromycin.  No orders of the defined types were placed in this encounter.  Patient to treat her sore throat symptomatically with Tylenol and Advil.  Get plenty of rest.  And checking IgM antibodies to rule out new infection with EBV or CMV.  CBC with differential is pending as well.  Follow-up: Return if symptoms worsen or fail to improve.  Libby Maw, MD

## 2018-05-24 LAB — CBC WITH DIFFERENTIAL/PLATELET
BASOS PCT: 0.7 % (ref 0.0–3.0)
Basophils Absolute: 0.1 10*3/uL (ref 0.0–0.1)
EOS PCT: 1.2 % (ref 0.0–5.0)
Eosinophils Absolute: 0.1 10*3/uL (ref 0.0–0.7)
HCT: 41.4 % (ref 36.0–46.0)
Hemoglobin: 14.1 g/dL (ref 12.0–15.0)
LYMPHS ABS: 2.3 10*3/uL (ref 0.7–4.0)
Lymphocytes Relative: 29.5 % (ref 12.0–46.0)
MCHC: 34 g/dL (ref 30.0–36.0)
MCV: 90.8 fl (ref 78.0–100.0)
MONO ABS: 0.4 10*3/uL (ref 0.1–1.0)
MONOS PCT: 5.2 % (ref 3.0–12.0)
NEUTROS ABS: 5 10*3/uL (ref 1.4–7.7)
NEUTROS PCT: 63.4 % (ref 43.0–77.0)
Platelets: 268 10*3/uL (ref 150.0–400.0)
RBC: 4.56 Mil/uL (ref 3.87–5.11)
RDW: 12.6 % (ref 11.5–15.5)
WBC: 7.9 10*3/uL (ref 4.0–10.5)

## 2018-05-25 LAB — CMV IGM: CMV IgM: <30 AU/mL

## 2018-05-25 LAB — EPSTEIN-BARR VIRUS VCA, IGM: EBV VCA IgM: <36 U/mL

## 2018-05-27 ENCOUNTER — Encounter: Payer: Self-pay | Admitting: Family Medicine

## 2018-05-29 ENCOUNTER — Encounter: Payer: Self-pay | Admitting: Family Medicine

## 2018-05-30 ENCOUNTER — Telehealth: Payer: Self-pay | Admitting: Family Medicine

## 2018-05-30 ENCOUNTER — Ambulatory Visit: Payer: 59 | Admitting: Family Medicine

## 2018-05-30 ENCOUNTER — Encounter: Payer: Self-pay | Admitting: Family Medicine

## 2018-05-30 VITALS — BP 124/80 | HR 90 | Temp 98.5°F | Ht 67.0 in | Wt 165.0 lb

## 2018-05-30 DIAGNOSIS — J029 Acute pharyngitis, unspecified: Secondary | ICD-10-CM | POA: Diagnosis not present

## 2018-05-30 DIAGNOSIS — R5383 Other fatigue: Secondary | ICD-10-CM

## 2018-05-30 MED ORDER — DOXYCYCLINE HYCLATE 100 MG PO TABS: 100.0000 mg | ORAL_TABLET | Freq: Two times a day (BID) | ORAL | 0 refills | Status: DC

## 2018-05-30 NOTE — Progress Notes (Signed)
Subjective:  Patient ID: Carolyn Hebert, female    DOB: Jul 17, 1976  Age: 42 y.o. MRN: [id number]  CC: not feeling better   HPI Carolyn Hebert presents for follow-up of her sore throat that is persisting despite previous treatment and a negative work-up.  She also complains of ongoing fatigue and malaise.  There is been no headache fever, chills, postnasal drip, cough or wheezing.  Denies myalgias or arthralgias.  Outpatient Medications Prior to Visit  Medication Sig Dispense Refill  . escitalopram (LEXAPRO) 20 MG tablet Take 1 tablet (20 mg total) by mouth daily. 90 tablet 3  . fluticasone (FLONASE) 50 MCG/ACT nasal spray Place 2 sprays into both nostrils daily. 16 g 2  . lisinopril (PRINIVIL,ZESTRIL) 10 MG tablet Take 1 tablet (10 mg total) by mouth daily. 30 tablet 3  . Olopatadine HCl (PATADAY) 0.2 % SOLN Apply 1 drop to eye daily. Both Eyes. 5 mL 5  . Thyroid (NATURE-THROID) 113.75 MG TABS Take 1 tablet by mouth daily.    Marland Kitchen azithromycin (ZITHROMAX) 250 MG tablet Take 2 tablets by mouth on day 1, followed by 1 tablet by mouth daily for 4 days. (Patient not taking: Reported on 05/23/2018) 6 tablet 0  . doxycycline (VIBRA-TABS) 100 MG tablet Take 1 tablet (100 mg total) by mouth 2 (two) times daily. (Patient not taking: Reported on 05/16/2018) 14 tablet 0  . phenazopyridine (PYRIDIUM) 100 MG tablet Take 1 tablet (100 mg total) by mouth 3 (three) times daily as needed for pain. (Patient not taking: Reported on 05/16/2018) 10 tablet 0  . promethazine-dextromethorphan (PROMETHAZINE-DM) 6.25-15 MG/5ML syrup Take 5 mLs by mouth 4 (four) times daily as needed. (Patient not taking: Reported on 05/16/2018) 118 mL 0   No facility-administered medications prior to visit.     ROS Review of Systems  Constitutional: Positive for fatigue. Negative for chills, fever and unexpected weight change.  HENT: Positive for sore throat. Negative for postnasal drip, trouble swallowing and voice change.     Eyes: Negative.   Respiratory: Negative.   Cardiovascular: Negative.   Gastrointestinal: Negative.   Musculoskeletal: Negative for arthralgias and myalgias.  Skin: Negative for pallor and rash.  Neurological: Negative for headaches.  Hematological: Positive for adenopathy. Does not bruise/bleed easily.  Psychiatric/Behavioral: Negative.     Objective:  BP 124/80   Pulse 90   Temp 98.5 F (36.9 C)   Ht 5\' 7"  (1.702 m)   Wt 165 lb (74.8 kg)   SpO2 98%   BMI 25.84 kg/m   BP Readings from Last 3 Encounters:  05/30/18 124/80  05/23/18 130/90  05/16/18 110/90    Wt Readings from Last 3 Encounters:  05/30/18 165 lb (74.8 kg)  05/23/18 165 lb (74.8 kg)  05/16/18 162 lb (73.5 kg)    Physical Exam  Constitutional: She is oriented to person, place, and time. She appears well-developed and well-nourished. No distress.  HENT:  Head: Normocephalic and atraumatic.  Right Ear: External ear normal.  Left Ear: External ear normal.  Mouth/Throat: Mucous membranes are normal. Posterior oropharyngeal erythema present. No oropharyngeal exudate or posterior oropharyngeal edema.  Eyes: Pupils are equal, round, and reactive to light. Conjunctivae and EOM are normal. Right eye exhibits no discharge. Left eye exhibits no discharge. No scleral icterus.  Neck: No JVD present. No tracheal deviation present. No thyromegaly present.  Cardiovascular: Normal rate, regular rhythm and normal heart sounds.  Pulmonary/Chest: Effort normal and breath sounds normal.  Lymphadenopathy:    She has  cervical adenopathy (shoddy with ttp.).  Neurological: She is alert and oriented to person, place, and time.  Skin: Capillary refill takes less than 2 seconds. No rash noted. She is not diaphoretic. No erythema. No pallor.  Psychiatric: She has a normal mood and affect. Her behavior is normal.    Lab Results  Component Value Date   WBC 7.9 05/23/2018   HGB 14.1 05/23/2018   HCT 41.4 05/23/2018   PLT 268.0  05/23/2018   GLUCOSE 102 (H) 12/19/2017   CHOL 164 12/19/2017   TRIG 154.0 (H) 12/19/2017   HDL 66.10 12/19/2017   LDLCALC 67 12/19/2017   ALT 13 12/19/2017   AST 15 12/19/2017   NA 135 12/19/2017   K 4.4 12/19/2017   CL 100 12/19/2017   CREATININE 0.77 12/19/2017   BUN 18 12/19/2017   CO2 24 12/19/2017   TSH 1.70 04/30/2013   MICROALBUR 0.2 04/30/2013    No results found.  Assessment & Plan:   Carolyn Hebert was seen today for not feeling better.  Diagnoses and all orders for this visit:  Pharyngitis, unspecified etiology -     doxycycline (VIBRA-TABS) 100 MG tablet; Take 1 tablet (100 mg total) by mouth 2 (two) times daily.   I have discontinued Carolyn Hebert. Gauer's phenazopyridine, doxycycline, promethazine-dextromethorphan, and azithromycin. I am also having her start on doxycycline. Additionally, I am having her maintain her Thyroid, fluticasone, Olopatadine HCl, lisinopril, and escitalopram.  Meds ordered this encounter  Medications  . doxycycline (VIBRA-TABS) 100 MG tablet    Sig: Take 1 tablet (100 mg total) by mouth 2 (two) times daily.    Dispense:  20 tablet    Refill:  0   Patient is headed to the beach for the next few days and will use sunscreen.  She will follow-up as needed.  Follow-up: Return if symptoms worsen or fail to improve.  Libby Maw, MD

## 2018-05-30 NOTE — Telephone Encounter (Signed)
Dr. Etter Sjogren,  Carolyn Hebert was sent in the office today by Dr. Abelino Derrick and she would like to transfer her care here at the [facility] because she states that she lives in [place] and the office is closer.  Please advise if the transfer is ok.

## 2018-05-30 NOTE — Telephone Encounter (Signed)
okay

## 2018-05-30 NOTE — Telephone Encounter (Signed)
ok 

## 2018-06-19 ENCOUNTER — Ambulatory Visit: Payer: 59 | Admitting: Family Medicine

## 2018-09-06 ENCOUNTER — Ambulatory Visit: Payer: 59 | Admitting: Family Medicine

## 2018-09-06 ENCOUNTER — Encounter: Payer: Self-pay | Admitting: Family Medicine

## 2018-09-06 VITALS — BP 122/90 | HR 87 | Temp 98.0°F | Ht 67.0 in | Wt 169.1 lb

## 2018-09-06 DIAGNOSIS — J069 Acute upper respiratory infection, unspecified: Secondary | ICD-10-CM

## 2018-09-06 DIAGNOSIS — J029 Acute pharyngitis, unspecified: Secondary | ICD-10-CM

## 2018-09-06 LAB — POCT RAPID STREP A (OFFICE): RAPID STREP A SCREEN: NEGATIVE

## 2018-09-06 MED ORDER — PREDNISONE 10 MG PO TABS: 10.0000 mg | ORAL_TABLET | Freq: Two times a day (BID) | ORAL | 0 refills | Status: AC

## 2018-09-06 NOTE — Progress Notes (Signed)
Subjective:  Patient ID: Carolyn Hebert, female    DOB: [birth year]  Age: 42 y.o. MRN: [id number]  CC: Sore Throat and Sinus Problem   HPI Carolyn Hebert presents for a 1 day history of sore throat with some postnasal drip, facial pressure in the cheek and frontal area.  She denies itchy watery eyes ears nose or throat.  There is been little sneeze.  Some elevation in temperature.  Appetite is been okay and there is been little cough.  Outpatient Medications Prior to Visit  Medication Sig Dispense Refill  . escitalopram (LEXAPRO) 20 MG tablet Take 1 tablet (20 mg total) by mouth daily. 90 tablet 3  . fluticasone (FLONASE) 50 MCG/ACT nasal spray Place 2 sprays into both nostrils daily. 16 g 2  . hydrocortisone (CORTEF) 5 MG tablet Take 5 mg by mouth 2 (two) times daily.    . Olopatadine HCl (PATADAY) 0.2 % SOLN Apply 1 drop to eye daily. Both Eyes. 5 mL 5  . Thyroid (NATURE-THROID) 113.75 MG TABS Take 1 tablet by mouth daily.    Marland Kitchen doxycycline (VIBRA-TABS) 100 MG tablet Take 1 tablet (100 mg total) by mouth 2 (two) times daily. 20 tablet 0  . lisinopril (PRINIVIL,ZESTRIL) 10 MG tablet Take 1 tablet (10 mg total) by mouth daily. 30 tablet 3   No facility-administered medications prior to visit.     ROS Review of Systems  Constitutional: Positive for fatigue. Negative for chills, fever and unexpected weight change.  HENT: Positive for congestion, postnasal drip, sinus pressure, sinus pain and sneezing.   Eyes: Negative for photophobia and visual disturbance.  Respiratory: Negative for cough and wheezing.   Cardiovascular: Negative.   Gastrointestinal: Negative for abdominal pain, nausea and vomiting.  Skin: Negative for pallor and rash.  Hematological: Does not bruise/bleed easily.  Psychiatric/Behavioral: Negative.     Objective:  BP 122/90   Pulse 87   Temp 98 F (36.7 C) (Oral)   Ht 5\' 7"  (1.702 m)   Wt 169 lb 2 oz (76.7 kg)   SpO2 96%   BMI 26.49 kg/m   BP  Readings from Last 3 Encounters:  09/06/18 122/90  05/30/18 124/80  05/23/18 130/90    Wt Readings from Last 3 Encounters:  09/06/18 169 lb 2 oz (76.7 kg)  05/30/18 165 lb (74.8 kg)  05/23/18 165 lb (74.8 kg)    Physical Exam  Constitutional: She appears well-developed and well-nourished.  Non-toxic appearance. She does not appear ill. No distress.  HENT:  Head: Normocephalic and atraumatic.  Right Ear: Hearing, tympanic membrane and ear canal normal.  Left Ear: Hearing, tympanic membrane and ear canal normal.  Mouth/Throat: No oral lesions. No uvula swelling. Posterior oropharyngeal erythema present. No posterior oropharyngeal edema or tonsillar abscesses.  Eyes: Pupils are equal, round, and reactive to light.  Neck: Normal range of motion. Neck supple. No thyromegaly present.  Cardiovascular: Normal rate, regular rhythm and normal heart sounds.  Pulmonary/Chest: Effort normal and breath sounds normal.  Lymphadenopathy:    She has cervical adenopathy (shoddy).    Lab Results  Component Value Date   WBC 7.9 05/23/2018   HGB 14.1 05/23/2018   HCT 41.4 05/23/2018   PLT 268.0 05/23/2018   GLUCOSE 102 (H) 12/19/2017   CHOL 164 12/19/2017   TRIG 154.0 (H) 12/19/2017   HDL 66.10 12/19/2017   LDLCALC 67 12/19/2017   ALT 13 12/19/2017   AST 15 12/19/2017   NA 135 12/19/2017   K 4.4 12/19/2017  CL 100 12/19/2017   CREATININE 0.77 12/19/2017   BUN 18 12/19/2017   CO2 24 12/19/2017   TSH 1.70 04/30/2013   MICROALBUR 0.2 04/30/2013    No results found.  Assessment & Plan:   Kadasia was seen today for sore throat and sinus problem.  Diagnoses and all orders for this visit:  Sore throat  Viral upper respiratory tract infection -     predniSONE (DELTASONE) 10 MG tablet; Take 1 tablet (10 mg total) by mouth 2 (two) times daily with a meal for 5 days.   I have discontinued Adisson Deak. Labreck's lisinopril and doxycycline. I am also having her start on predniSONE.  Additionally, I am having her maintain her Thyroid, fluticasone, Olopatadine HCl, escitalopram, and hydrocortisone.  Meds ordered this encounter  Medications  . predniSONE (DELTASONE) 10 MG tablet    Sig: Take 1 tablet (10 mg total) by mouth 2 (two) times daily with a meal for 5 days.    Dispense:  10 tablet    Refill:  0   Strep test was negative.  We will try the brief course of low-dose prednisone for 5 days.  Let me know how she is doing about this time next week.  Follow-up: No follow-ups on file.  Libby Maw, MD

## 2018-12-30 ENCOUNTER — Other Ambulatory Visit: Payer: Self-pay | Admitting: Family Medicine

## 2018-12-30 DIAGNOSIS — F418 Other specified anxiety disorders: Secondary | ICD-10-CM

## 2019-01-01 ENCOUNTER — Encounter: Payer: Self-pay | Admitting: Family Medicine

## 2019-01-01 DIAGNOSIS — F418 Other specified anxiety disorders: Secondary | ICD-10-CM

## 2019-01-01 MED ORDER — ESCITALOPRAM OXALATE 20 MG PO TABS: 20.0000 mg | ORAL_TABLET | Freq: Every day | ORAL | 2 refills | Status: DC

## 2019-03-05 ENCOUNTER — Other Ambulatory Visit: Payer: Self-pay

## 2019-03-05 ENCOUNTER — Encounter (HOSPITAL_BASED_OUTPATIENT_CLINIC_OR_DEPARTMENT_OTHER): Payer: Self-pay | Admitting: *Deleted

## 2019-03-05 ENCOUNTER — Emergency Department (HOSPITAL_BASED_OUTPATIENT_CLINIC_OR_DEPARTMENT_OTHER)
Admission: EM | Admit: 2019-03-05 | Discharge: 2019-03-05 | Disposition: A | Discharge status: Home / Self Care | Payer: 59 | Attending: Emergency Medicine | Admitting: Emergency Medicine

## 2019-03-05 ENCOUNTER — Emergency Department (HOSPITAL_BASED_OUTPATIENT_CLINIC_OR_DEPARTMENT_OTHER): Payer: 59

## 2019-03-05 DIAGNOSIS — I10 Essential (primary) hypertension: Secondary | ICD-10-CM | POA: Insufficient documentation

## 2019-03-05 DIAGNOSIS — Z87891 Personal history of nicotine dependence: Secondary | ICD-10-CM | POA: Insufficient documentation

## 2019-03-05 DIAGNOSIS — B9789 Other viral agents as the cause of diseases classified elsewhere: Secondary | ICD-10-CM

## 2019-03-05 DIAGNOSIS — R079 Chest pain, unspecified: Secondary | ICD-10-CM | POA: Diagnosis present

## 2019-03-05 DIAGNOSIS — J988 Other specified respiratory disorders: Secondary | ICD-10-CM | POA: Diagnosis not present

## 2019-03-05 DIAGNOSIS — J45909 Unspecified asthma, uncomplicated: Secondary | ICD-10-CM | POA: Diagnosis not present

## 2019-03-05 DIAGNOSIS — Z79899 Other long term (current) drug therapy: Secondary | ICD-10-CM | POA: Insufficient documentation

## 2019-03-05 LAB — CBC WITH DIFFERENTIAL/PLATELET
Abs Immature Granulocytes: 0.02 10*3/uL (ref 0.00–0.07)
Basophils Absolute: 0 10*3/uL (ref 0.0–0.1)
Basophils Relative: 0 %
Eosinophils Absolute: 0.1 10*3/uL (ref 0.0–0.5)
Eosinophils Relative: 1 %
HCT: 40.9 % (ref 36.0–46.0)
Hemoglobin: 13.7 g/dL (ref 12.0–15.0)
Immature Granulocytes: 0 %
Lymphocytes Relative: 19 %
Lymphs Abs: 1.5 10*3/uL (ref 0.7–4.0)
MCH: 31.1 pg (ref 26.0–34.0)
MCHC: 33.5 g/dL (ref 30.0–36.0)
MCV: 93 fL (ref 80.0–100.0)
Monocytes Absolute: 0.5 10*3/uL (ref 0.1–1.0)
Monocytes Relative: 6 %
Neutro Abs: 5.8 10*3/uL (ref 1.7–7.7)
Neutrophils Relative %: 74 %
Platelets: 262 10*3/uL (ref 150–400)
RBC: 4.4 MIL/uL (ref 3.87–5.11)
RDW: 12.4 % (ref 11.5–15.5)
WBC: 7.9 10*3/uL (ref 4.0–10.5)
nRBC: 0 % (ref 0.0–0.2)

## 2019-03-05 LAB — BASIC METABOLIC PANEL
Anion gap: 7 (ref 5–15)
BUN: 18 mg/dL (ref 6–20)
CO2: 24 mmol/L (ref 22–32)
Calcium: 9 mg/dL (ref 8.9–10.3)
Chloride: 103 mmol/L (ref 98–111)
Creatinine, Ser: 0.55 mg/dL (ref 0.44–1.00)
GFR calc Af Amer: >60 mL/min (ref >60)
GFR calc non Af Amer: >60 mL/min (ref >60)
Glucose, Bld: 99 mg/dL (ref 70–99)
Potassium: 4.6 mmol/L (ref 3.5–5.1)
Sodium: 134 mmol/L — ABNORMAL LOW (ref 135–145)

## 2019-03-05 NOTE — ED Triage Notes (Signed)
2 days she has nausea, abdominal pain and tightness in her chest. Yesterday she had a fever and SOB. She is alert oriented.

## 2019-03-05 NOTE — ED Provider Notes (Signed)
[place] EMERGENCY DEPARTMENT Provider Note   CSN: [id number] Arrival date & time: 03/05/19  1233    History   Chief Complaint Chief Complaint  Patient presents with  . Fever  . Cough  . Shortness of Breath    HPI Carolyn Hebert is a 43 y.o. female.     Patient presents to the emergency department today with a 3-day total history of chest pain, shortness of breath, occasional cough.  She has had associated nausea without vomiting.  She reports having an elevated temperature yesterday into the 98's.  Typically her temperature runs in the 97's.  No lower extremity swelling or pain.  No treatments prior to arrival.  No known sick contacts or COVID-19 exposures.  Patient states that she is worried because she has underlying Lyme disease which causes her immune system to be weak.  Also states that she has been very tired lately.  Onset of symptoms acute.  Course is constant.  Nothing makes symptoms better.     Past Medical History:  Diagnosis Date  . Abnormal Pap smear   . Anxiety   . Depression   . Dysplastic nevus   . HSV-2 infection   . IBS (irritable bowel syndrome)   . LGSIL (low grade squamous intraepithelial lesion) on Pap smear   . Lyme disease   . Ovarian cyst     Patient Active Problem List   Diagnosis Date Noted  . Pharyngitis 05/23/2018  . Sore throat 05/23/2018  . Lyme disease, unspecified 01/31/2018  . Essential hypertension 09/22/2017  . Acute bacterial sinusitis 10/21/2015  . Dysuria 09/29/2015  . Asthma with acute exacerbation 12/01/2014  . Acute bacterial bronchitis 11/12/2014  . Sinusitis, acute maxillary 10/27/2014  . Vertigo 11/18/2013  . Obesity (BMI 30-39.9) 10/12/2013  . HTN (hypertension) 05/08/2013  . Asthma, chronic-Moderate Persistent  11/06/2012  . Personal history of chronic sinusitis 11/02/2012  . Viral upper respiratory tract infection 10/19/2012  . Ovarian cyst   . LGSIL (low grade squamous intraepithelial lesion)  on Pap smear   . Anxiety   . Depression   . HSV-2 infection   . ALLERGIC RHINITIS 06/20/2008  . UNDIAGNOSED CARDIAC MURMURS 05/21/2008  . FATIGUE 04/11/2008    Past Surgical History:  Procedure Laterality Date  . CESAREAN SECTION    . CYSTECTOMY    . LAPAROSCOPY    . SEPTOPLASTY WITH ETHMOIDECTOMY, AND MAXILLARY ANTROSTOMY  [place] , [place]  . WISDOM TOOTH EXTRACTION       OB History    Gravida  1   Para  1   Term      Preterm      AB      Living  1     SAB      TAB      Ectopic      Multiple      Live Births               Home Medications    Prior to Admission medications   Medication Sig Start Date End Date Taking? Authorizing Provider  escitalopram (LEXAPRO) 20 MG tablet Take 1 tablet (20 mg total) by mouth daily. 01/01/19   Libby Maw, MD  fluticasone [facility]) 50 MCG/ACT nasal spray Place 2 sprays into both nostrils daily. 09/22/17   Ann Held, DO  hydrocortisone (CORTEF) 5 MG tablet Take 5 mg by mouth 2 (two) times daily.    [provider]  Olopatadine  HCl (PATADAY) 0.2 % SOLN Apply 1 drop to eye daily. Both Eyes. 09/22/17   Ann Held, DO  Thyroid (NATURE-THROID) 113.75 MG TABS Take 1 tablet by mouth daily.    [provider]    Family History Family History  Problem Relation Age of Onset  . Cancer Maternal Aunt   . Cancer Maternal Grandfather        prostate/lung  . Hypertension Father   . Hyperlipidemia Mother   . Cancer Maternal Uncle        melanoma  . Deep vein thrombosis Maternal Grandmother     Social History Social History   Tobacco Use  . Smoking status: Former Smoker    Years: 7.00    Types: Cigarettes    Last attempt to quit: 11/29/1999    Years since quitting: 19.2  . Smokeless tobacco: Never Used  . Tobacco comment: "couple of cigs on weekend"  Substance Use Topics  . Alcohol use: Yes    Comment: 2-3 beers per week  . Drug use: No     Allergies    Penicillins; Gluten meal; and Lactose intolerance (gi)   Review of Systems Review of Systems  Constitutional: Positive for chills and fatigue. Negative for fever.  HENT: Negative for rhinorrhea and sore throat.   Eyes: Negative for redness.  Respiratory: Positive for cough, chest tightness and shortness of breath.   Cardiovascular: Negative for leg swelling.  Gastrointestinal: Positive for nausea. Negative for abdominal pain, diarrhea and vomiting.  Genitourinary: Negative for dysuria, frequency and hematuria.  Musculoskeletal: Positive for myalgias.  Skin: Negative for rash.  Neurological: Negative for headaches.     Physical Exam Updated Vital Signs BP (!) 133/94   Pulse 96   Temp 98.4 F (36.9 C) (Oral)   Resp 20   Ht 5\' 7"  (1.702 m)   Wt 77.1 kg   LMP 02/19/2019   SpO2 98%   BMI 26.63 kg/m   Physical Exam Vitals signs and nursing note reviewed.  Constitutional:      Appearance: She is well-developed.  HENT:     Head: Normocephalic and atraumatic.  Eyes:     General:        Right eye: No discharge.        Left eye: No discharge.     Conjunctiva/sclera: Conjunctivae normal.  Neck:     Musculoskeletal: Normal range of motion and neck supple.  Cardiovascular:     Rate and Rhythm: Normal rate and regular rhythm.     Heart sounds: Normal heart sounds.  Pulmonary:     Effort: Pulmonary effort is normal.     Breath sounds: Normal breath sounds. No decreased breath sounds, wheezing, rhonchi or rales.  Abdominal:     Palpations: Abdomen is soft.     Tenderness: There is no abdominal tenderness.  Skin:    General: Skin is warm and dry.  Neurological:     Mental Status: She is alert.      ED Treatments / Results  Labs (all labs ordered are listed, but only abnormal results are displayed) Labs Reviewed  BASIC METABOLIC PANEL - Abnormal; Notable for the following components:      Result Value   Sodium 134 (*)    All other components within normal limits   CBC WITH DIFFERENTIAL/PLATELET    ED ECG REPORT   Date: 03/05/2019  Rate: 73  Rhythm: normal sinus rhythm  QRS Axis: normal  Intervals: normal  ST/T Wave abnormalities: normal  Conduction Disutrbances:none  Narrative Interpretation:   Old EKG Reviewed: unchanged from 01/31/18  I have personally reviewed the EKG tracing and agree with the computerized printout as noted.  Radiology Dg Chest Portable 1 View  Result Date: 03/05/2019 CLINICAL DATA:  Fever and shortness of breath.  Chest tightness. EXAM: PORTABLE CHEST 1 VIEW COMPARISON:  Chest radiograph May 16, 2008 and chest CT May 23, 2008 FINDINGS: No edema or consolidation. There are occasional tiny granulomas in the left upper lobe. Heart size and pulmonary vascularity are normal. No adenopathy. No bone lesions. No pneumothorax. IMPRESSION: No edema or consolidation.  Tiny granulomas left upper lobe. Electronically Signed   By: Lowella Grip III M.D.   On: 03/05/2019 13:50    Procedures Procedures (including critical care time)  Medications Ordered in ED Medications - No data to display   Initial Impression / Assessment and Plan / ED Course  I have reviewed the triage vital signs and the nursing notes.  Pertinent labs & imaging results that were available during my care of the patient were reviewed by me and considered in my medical decision making (see chart for details).        Patient seen and examined. Work-up initiated. Vital signs are reassuring.  Discussed indications for testing with patient. She will need to isolate and quarantine.   Vital signs reviewed and are as follows: BP (!) 133/94   Pulse 96   Temp 98.4 F (36.9 C) (Oral)   Resp 20   Ht 5\' 7"  (1.702 m)   Wt 77.1 kg   LMP 02/19/2019   SpO2 98%   BMI 26.63 kg/m   2:23 PM reviewed results with patient.  Discussed negative x-ray imaging for signs of pneumonia.  Discussed normal white blood cell count with normal lymphocyte count.  Patient given  a note for work and instructions on home isolation for the next 7 days with at least 3 days of improvement.  Discussed warning signs and symptoms to return including worsening shortness of breath or trouble breathing. .  Patient seems reliable to return if worsening.  Final Clinical Impressions(s) / ED Diagnoses   Final diagnoses:  Viral respiratory illness   Patient with respiratory symptoms over the past 3 days.  Subjective fevers but no documented fever.  She is on chronic steroids and labs were checked for this reason.  Chest x-ray is clear.  Lab work-up is reassuring with normal white blood cell count and lymphocyte count.  Low concern for COVID-19 at this time, however vital signs are within normal limits and feel it is safe for patient to self isolate and treat at home conservatively.  We discussed signs and symptoms to return.  Do not suspect ACS given history and normal EKG.  Patient does not have any significant risk factors or clinical exam findings suggestive of pulmonary embolism.  She appears well and nontoxic at time of exam.  ED Discharge Orders    None       Carlisle Cater, PA-C 03/05/19 Francis, Wenda Overland, MD 03/05/19 1505

## 2019-04-16 ENCOUNTER — Telehealth: Payer: Self-pay | Admitting: Family Medicine

## 2019-05-09 NOTE — Telephone Encounter (Signed)
Error

## 2019-08-09 ENCOUNTER — Other Ambulatory Visit: Payer: Self-pay | Admitting: Obstetrics and Gynecology

## 2019-09-30 ENCOUNTER — Other Ambulatory Visit: Payer: Self-pay | Admitting: Family Medicine

## 2019-09-30 DIAGNOSIS — F418 Other specified anxiety disorders: Secondary | ICD-10-CM

## 2019-10-21 IMAGING — DX PORTABLE CHEST - 1 VIEW
1 series · 1 of 1 positions shown · non-contrast
Comparison: Chest radiograph May 16, 2008 and chest CT May 23, 2008

CLINICAL DATA: Fever and shortness of breath.  Chest tightness.

EXAM:
PORTABLE CHEST 1 VIEW

[chest ap]
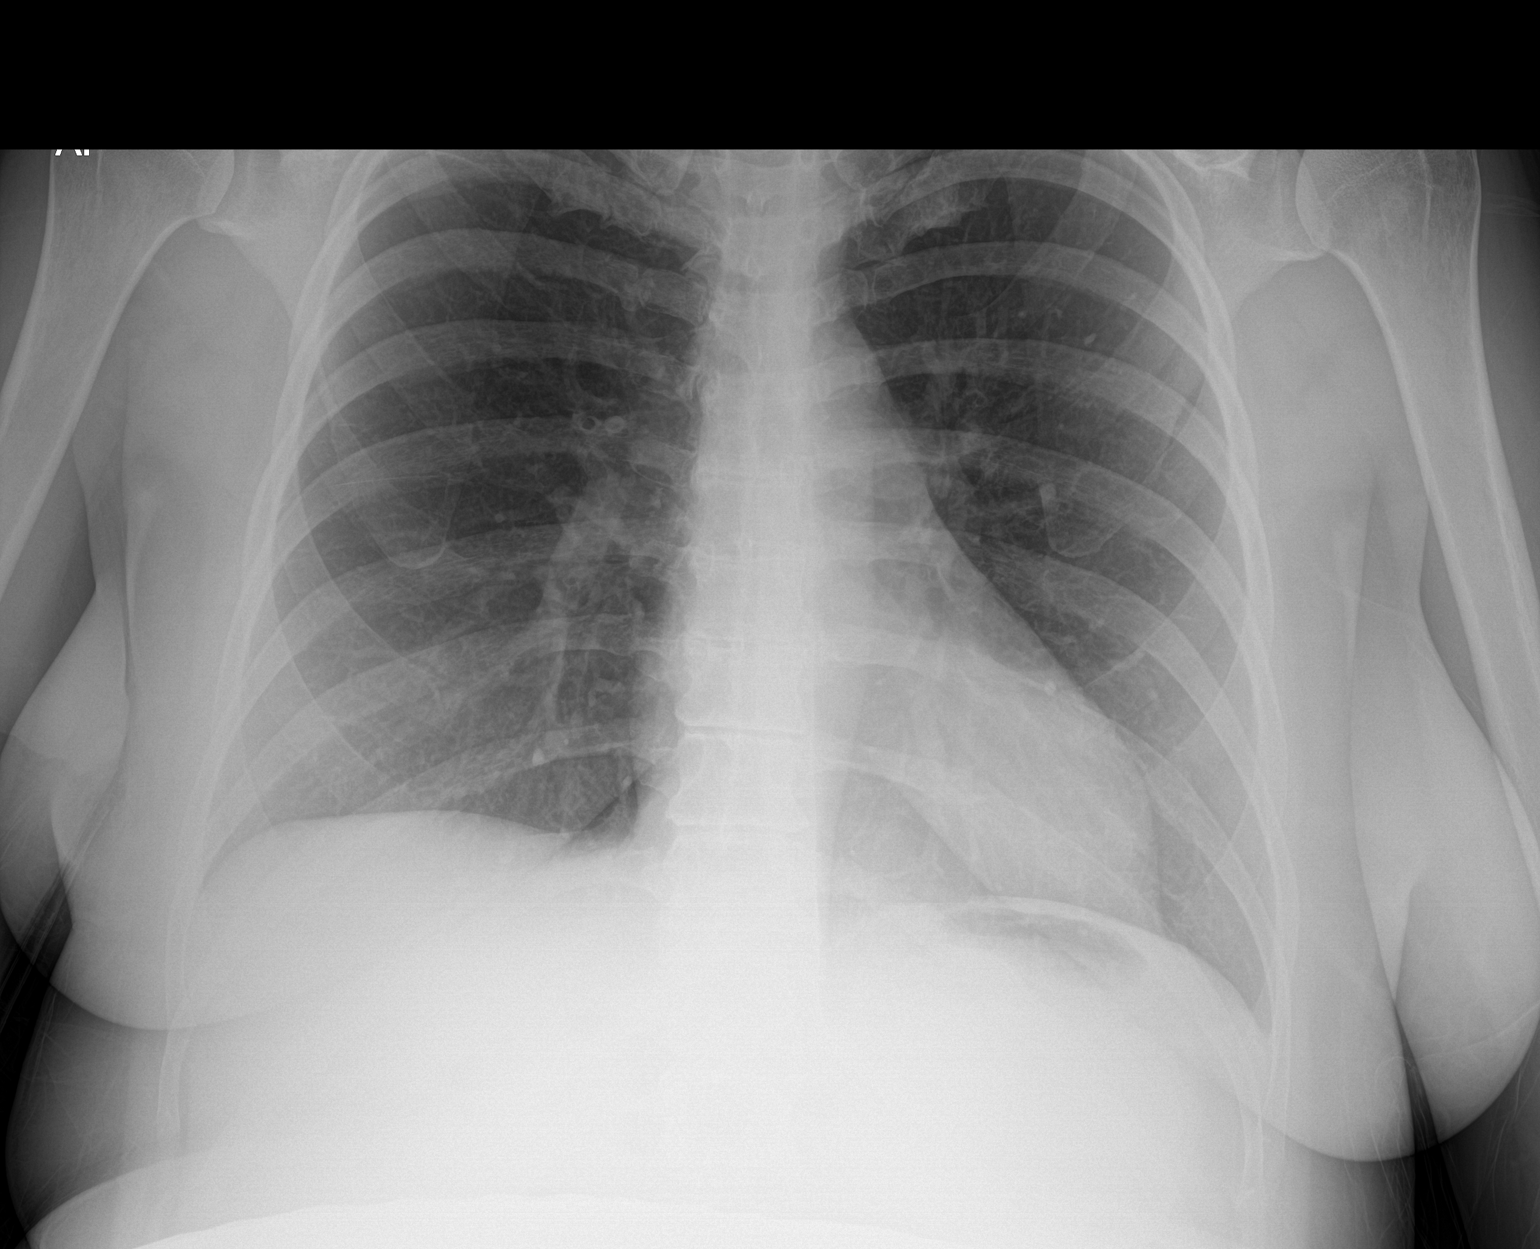

[1 of 1 positions shown; findings below may reference images not displayed]

FINDINGS: No edema or consolidation. There are occasional tiny granulomas in
the left upper lobe. Heart size and pulmonary vascularity are
normal. No adenopathy. No bone lesions. No pneumothorax.
IMPRESSION: No edema or consolidation.  Tiny granulomas left upper lobe.

## 2019-11-08 ENCOUNTER — Encounter: Payer: Self-pay | Admitting: Family Medicine

## 2019-11-08 ENCOUNTER — Telehealth (INDEPENDENT_AMBULATORY_CARE_PROVIDER_SITE_OTHER): Payer: 59 | Admitting: Family Medicine

## 2019-11-08 DIAGNOSIS — J019 Acute sinusitis, unspecified: Secondary | ICD-10-CM

## 2019-11-08 DIAGNOSIS — B9689 Other specified bacterial agents as the cause of diseases classified elsewhere: Secondary | ICD-10-CM | POA: Diagnosis not present

## 2019-11-08 MED ORDER — DOXYCYCLINE HYCLATE 100 MG PO TABS: 100.0000 mg | ORAL_TABLET | Freq: Two times a day (BID) | ORAL | 0 refills | Status: DC

## 2019-11-08 NOTE — Assessment & Plan Note (Signed)
Start doxcycyline 100mg  BID (PCN allergy) Continue home supportive care, increase fluids and rest.  Tylenol or ibuprofen/aleve as needed for aches pain.  Warm compress to sinus may be helpful as well.  Call for any new or worsening symptoms.

## 2019-11-08 NOTE — Progress Notes (Signed)
Carolyn Hebert - 43 y.o. female MRN [id number]  Date of birth: 1976/09/13   This visit type was conducted due to national recommendations for restrictions regarding the COVID-19 Pandemic (e.g. social distancing).  This format is felt to be most appropriate for this patient at this time.  All issues noted in this document were discussed and addressed.  No physical exam was performed (except for noted visual exam findings with Video Visits).  I discussed the limitations of evaluation and management by telemedicine and the availability of in person appointments. The patient expressed understanding and agreed to proceed.  I connected with@ on 11/08/19 at 10:00 AM EST by a video enabled telemedicine application and verified that I am speaking with the correct person using two identifiers.  Present at visit: Carolyn Nutting, DO Carolyn Hebert   Patient Location: Home [place] [id number]   Provider location:   [place]  Chief Complaint  Patient presents with  . Sinusitis    sinus infection, x2days    HPI  Carolyn Hebert is a 43 y.o. female who presents via audio/video conferencing for a telehealth visit today.  She has complaint of sinus pain and pressure x2 days.  She has associated headache, green/yellow nasal discharge, and post nasal drainage.  She has not had fever.  She denies cough or shortness of breath.  She reports that she gets sinus infections about 1-2x per year and current episode feels similar to previous episodes.    ROS:  A comprehensive ROS was completed and negative except as noted per HPI  Past Medical History:  Diagnosis Date  . Abnormal Pap smear   . Anxiety   . Depression   . Dysplastic nevus   . HSV-2 infection   . IBS (irritable bowel syndrome)   . LGSIL (low grade squamous intraepithelial lesion) on Pap smear   . Lyme disease   . Ovarian cyst     Past Surgical History:  Procedure Laterality  Date  . CESAREAN SECTION    . CYSTECTOMY    . LAPAROSCOPY    . SEPTOPLASTY WITH ETHMOIDECTOMY, AND MAXILLARY ANTROSTOMY  [place] , [place]  . WISDOM TOOTH EXTRACTION      Family History  Problem Relation Age of Onset  . Cancer Maternal Aunt   . Cancer Maternal Grandfather        prostate/lung  . Hypertension Father   . Hyperlipidemia Mother   . Cancer Maternal Uncle        melanoma  . Deep vein thrombosis Maternal Grandmother     Social History   Socioeconomic History  . Marital status: Divorced    Spouse name: Not on file  . Number of children: 1  . Years of education: Not on file  . Highest education level: Not on file  Occupational History  . Occupation: Press photographer  Tobacco Use  . Smoking status: Former Smoker    Years: 7.00    Types: Cigarettes    Quit date: 11/29/1999    Years since quitting: 19.9  . Smokeless tobacco: Never Used  . Tobacco comment: "couple of cigs on weekend"  Substance and Sexual Activity  . Alcohol use: Yes    Comment: 2-3 beers per week  . Drug use: No  . Sexual activity: Yes    Birth control/protection: Pill  Other Topics Concern  . Not on file  Social History Narrative  . Not on file   Social  Determinants of Health   Financial Resource Strain:   . Difficulty of Paying Living Expenses: Not on file  Food Insecurity:   . Worried About Charity fundraiser in the Last Year: Not on file  . Ran Out of Food in the Last Year: Not on file  Transportation Needs:   . Lack of Transportation (Medical): Not on file  . Lack of Transportation (Non-Medical): Not on file  Physical Activity:   . Days of Exercise per Week: Not on file  . Minutes of Exercise per Session: Not on file  Stress:   . Feeling of Stress : Not on file  Social Connections:   . Frequency of Communication with Friends and Family: Not on file  . Frequency of Social Gatherings with Friends and Family: Not on file  . Attends Religious Services: Not on file  . Active Member  of Clubs or Organizations: Not on file  . Attends Archivist Meetings: Not on file  . Marital Status: Not on file  Intimate Partner Violence:   . Fear of Current or Ex-Partner: Not on file  . Emotionally Abused: Not on file  . Physically Abused: Not on file  . Sexually Abused: Not on file     Current Outpatient Medications:  .  escitalopram (LEXAPRO) 20 MG tablet, TAKE ONE TABLET BY MOUTH DAILY, Disp: 30 tablet, Rfl: 1 .  fluticasone (FLONASE) 50 MCG/ACT nasal spray, Place 2 sprays into both nostrils daily., Disp: 16 g, Rfl: 2 .  Olopatadine HCl (PATADAY) 0.2 % SOLN, Apply 1 drop to eye daily. Both Eyes., Disp: 5 mL, Rfl: 5 .  Thyroid (NATURE-THROID) 113.75 MG TABS, Take 1 tablet by mouth daily., Disp: , Rfl:  .  doxycycline (VIBRA-TABS) 100 MG tablet, Take 1 tablet (100 mg total) by mouth 2 (two) times daily., Disp: 20 tablet, Rfl: 0  EXAM:  VITALS per patient if applicable: Temp (!) 123XX123 F (36.2 C) (Oral)   Ht 5\' 7"  (1.702 m)   Wt 177 lb (80.3 kg)   BMI 27.72 kg/m   GENERAL: alert, oriented, appears well and in no acute distress  HEENT: atraumatic, conjunttiva clear, no obvious abnormalities on inspection of external nose and ears  NECK: normal movements of the head and neck  LUNGS: on inspection no signs of respiratory distress, breathing rate appears normal, no obvious gross SOB, gasping or wheezing  CV: no obvious cyanosis  MS: moves all visible extremities without noticeable abnormality  PSYCH/NEURO: pleasant and cooperative, no obvious depression or anxiety, speech and thought processing grossly intact  ASSESSMENT AND PLAN:  Discussed the following assessment and plan:  Acute bacterial sinusitis Start doxcycyline 100mg  BID (PCN allergy) Continue home supportive care, increase fluids and rest.  Tylenol or ibuprofen/aleve as needed for aches pain.  Warm compress to sinus may be helpful as well.  Call for any new or worsening symptoms.      I  discussed the assessment and treatment plan with the patient. The patient was provided an opportunity to ask questions and all were answered. The patient agreed with the plan and demonstrated an understanding of the instructions.   The patient was advised to call back or seek an in-person evaluation if the symptoms worsen or if the condition fails to improve as anticipated.    Carolyn Nutting, DO

## 2019-12-03 ENCOUNTER — Other Ambulatory Visit: Payer: Self-pay | Admitting: Family Medicine

## 2019-12-03 DIAGNOSIS — F418 Other specified anxiety disorders: Secondary | ICD-10-CM

## 2019-12-05 ENCOUNTER — Telehealth: Payer: Self-pay | Admitting: Family Medicine

## 2019-12-05 NOTE — Telephone Encounter (Signed)
Patient is calling and stated that her medication refill for Lexapro was denied and would like a call back. CB is [phone].

## 2019-12-05 NOTE — Telephone Encounter (Signed)
Patient coming in for f/u on medications  12/09/19

## 2019-12-09 ENCOUNTER — Encounter: Payer: Self-pay | Admitting: Family Medicine

## 2019-12-09 ENCOUNTER — Other Ambulatory Visit: Payer: Self-pay

## 2019-12-09 ENCOUNTER — Ambulatory Visit (INDEPENDENT_AMBULATORY_CARE_PROVIDER_SITE_OTHER): Payer: 59 | Admitting: Family Medicine

## 2019-12-09 VITALS — BP 122/78 | HR 80 | Temp 97.1°F | Ht 67.0 in | Wt 178.6 lb

## 2019-12-09 DIAGNOSIS — F418 Other specified anxiety disorders: Secondary | ICD-10-CM | POA: Diagnosis not present

## 2019-12-09 MED ORDER — ESCITALOPRAM OXALATE 20 MG PO TABS: 20.0000 mg | ORAL_TABLET | Freq: Every day | ORAL | 3 refills | Status: DC

## 2019-12-09 NOTE — Patient Instructions (Signed)
Mindfulness-Based Stress Reduction Mindfulness-based stress reduction (MBSR) is a program that helps people learn to practice mindfulness. Mindfulness is the practice of intentionally paying attention to the present moment. It can be learned and practiced through techniques such as education, breathing exercises, meditation, and yoga. MBSR includes several mindfulness techniques in one program. MBSR works best when you understand the treatment, are willing to try new things, and can commit to spending time practicing what you learn. MBSR training may include learning about:  How your emotions, thoughts, and reactions affect your body.  New ways to respond to things that cause negative thoughts to start (triggers).  How to notice your thoughts and let go of them.  Practicing awareness of everyday things that you normally do without thinking.  The techniques and goals of different types of meditation. What are the benefits of MBSR? MBSR can have many benefits, which include helping you to:  Develop self-awareness. This refers to knowing and understanding yourself.  Learn skills and attitudes that help you to participate in your own health care.  Learn new ways to care for yourself.  Be more accepting about how things are, and let things go.  Be less judgmental and approach things with an open mind.  Be patient with yourself and trust yourself more. MBSR has also been shown to:  Reduce negative emotions, such as depression and anxiety.  Improve memory and focus.  Change how you sense and approach pain.  Boost your body's ability to fight infections.  Help you connect better with other people.  Improve your sense of well-being. Follow these instructions at home:   Find a local in-person or online MBSR program.  Set aside some time regularly for mindfulness practice.  Find a mindfulness practice that works best for you. This may include one or more of the  following: ? Meditation. Meditation involves focusing your mind on a certain thought or activity. ? Breathing awareness exercises. These help you to stay present by focusing on your breath. ? Body scan. For this practice, you lie down and pay attention to each part of your body from head to toe. You can identify tension and soreness and intentionally relax parts of your body. ? Yoga. Yoga involves stretching and breathing, and it can improve your ability to move and be flexible. It can also provide an experience of testing your body's limits, which can help you release stress. ? Mindful eating. This way of eating involves focusing on the taste, texture, color, and smell of each bite of food. Because this slows down eating and helps you feel full sooner, it can be an important part of a weight-loss plan.  Find a podcast or recording that provides guidance for breathing awareness, body scan, or meditation exercises. You can listen to these any time when you have a free moment to rest without distractions.  Follow your treatment plan as told by your health care provider. This may include taking regular medicines and making changes to your diet or lifestyle as recommended. How to practice mindfulness To do a basic awareness exercise:  Find a comfortable place to sit.  Pay attention to the present moment. Observe your thoughts, feelings, and surroundings just as they are.  Avoid placing judgment on yourself, your feelings, or your surroundings. Make note of any judgment that comes up, and let it go.  Your mind may wander, and that is okay. Make note of when your thoughts drift, and return your attention to the present moment. To do   basic mindfulness meditation:  Find a comfortable place to sit. This may include a stable chair or a firm floor cushion. ? Sit upright with your back straight. Let your arms fall next to your side with your hands resting on your legs. ? If sitting in a chair, rest your  feet flat on the floor. ? If sitting on a cushion, cross your legs in front of you.  Keep your head in a neutral position with your chin dropped slightly. Relax your jaw and rest the tip of your tongue on the roof of your mouth. Drop your gaze to the floor. You can close your eyes if you like.  Breathe normally and pay attention to your breath. Feel the air moving in and out of your nose. Feel your belly expanding and relaxing with each breath.  Your mind may wander, and that is okay. Make note of when your thoughts drift, and return your attention to your breath.  Avoid placing judgment on yourself, your feelings, or your surroundings. Make note of any judgment or feelings that come up, let them go, and bring your attention back to your breath.  When you are ready, lift your gaze or open your eyes. Pay attention to how your body feels after the meditation. Where to find more information You can find more information about MBSR from:  Your health care provider.  Community-based meditation centers or programs.  Programs offered near you. Summary  Mindfulness-based stress reduction (MBSR) is a program that teaches you how to intentionally pay attention to the present moment. It is used with other treatments to help you cope better with daily stress, emotions, and pain.  MBSR focuses on developing self-awareness, which allows you to respond to life stress without judgment or negative emotions.  MBSR programs may involve learning different mindfulness practices, such as breathing exercises, meditation, yoga, body scan, or mindful eating. Find a mindfulness practice that works best for you, and set aside time for it on a regular basis. This information is not intended to replace advice given to you by your health care provider. Make sure you discuss any questions you have with your health care provider. Document Revised: 10/27/2017 Document Reviewed: 03/23/2017 Elsevier Patient Education   2020 [employer] Inc.  

## 2019-12-09 NOTE — Progress Notes (Signed)
Established Patient Office Visit  Subjective:  Patient ID: Carolyn Hebert, female    DOB: June 24, 1976  Age: 44 y.o. MRN: [id number]  CC:  Chief Complaint  Patient presents with  . Medication Refill    Lexapro    HPI Carolyn Hebert presents for follow-up of her depression with anxiety.  It has responded well to the Lexapro.  She has taken this medicine over the last 10 years and it is worked well for her.  She had suffered relapse when she had attempted to discontinue it in the past.  Reports that she continues to suffer from chronic Lyme disease.  Past Medical History:  Diagnosis Date  . Abnormal Pap smear   . Anxiety   . Depression   . Dysplastic nevus   . HSV-2 infection   . IBS (irritable bowel syndrome)   . LGSIL (low grade squamous intraepithelial lesion) on Pap smear   . Lyme disease   . Ovarian cyst     Past Surgical History:  Procedure Laterality Date  . CESAREAN SECTION    . CYSTECTOMY    . LAPAROSCOPY    . SEPTOPLASTY WITH ETHMOIDECTOMY, AND MAXILLARY ANTROSTOMY  [place] , [place]  . WISDOM TOOTH EXTRACTION      Family History  Problem Relation Age of Onset  . Cancer Maternal Aunt   . Cancer Maternal Grandfather        prostate/lung  . Hypertension Father   . Hyperlipidemia Mother   . Cancer Maternal Uncle        melanoma  . Deep vein thrombosis Maternal Grandmother     Social History   Socioeconomic History  . Marital status: Divorced    Spouse name: Not on file  . Number of children: 1  . Years of education: Not on file  . Highest education level: Not on file  Occupational History  . Occupation: Press photographer  Tobacco Use  . Smoking status: Former Smoker    Years: 7.00    Types: Cigarettes    Quit date: 11/29/1999    Years since quitting: 20.0  . Smokeless tobacco: Never Used  . Tobacco comment: "couple of cigs on weekend"  Substance and Sexual Activity  . Alcohol use: Yes    Comment: 2-3 beers per week  . Drug use: No  .  Sexual activity: Yes    Birth control/protection: Pill  Other Topics Concern  . Not on file  Social History Narrative  . Not on file   Social Determinants of Health   Financial Resource Strain:   . Difficulty of Paying Living Expenses: Not on file  Food Insecurity:   . Worried About Charity fundraiser in the Last Year: Not on file  . Ran Out of Food in the Last Year: Not on file  Transportation Needs:   . Lack of Transportation (Medical): Not on file  . Lack of Transportation (Non-Medical): Not on file  Physical Activity:   . Days of Exercise per Week: Not on file  . Minutes of Exercise per Session: Not on file  Stress:   . Feeling of Stress : Not on file  Social Connections:   . Frequency of Communication with Friends and Family: Not on file  . Frequency of Social Gatherings with Friends and Family: Not on file  . Attends Religious Services: Not on file  . Active Member of Clubs or Organizations: Not on file  . Attends Archivist Meetings: Not on file  .  Marital Status: Not on file  Intimate Partner Violence:   . Fear of Current or Ex-Partner: Not on file  . Emotionally Abused: Not on file  . Physically Abused: Not on file  . Sexually Abused: Not on file    Outpatient Medications Prior to Visit  Medication Sig Dispense Refill  . fluticasone (FLONASE) 50 MCG/ACT nasal spray Place 2 sprays into both nostrils daily. 16 g 2  . Olopatadine HCl (PATADAY) 0.2 % SOLN Apply 1 drop to eye daily. Both Eyes. 5 mL 5  . Thyroid (NATURE-THROID) 113.75 MG TABS Take 1 tablet by mouth daily.    Marland Kitchen escitalopram (LEXAPRO) 20 MG tablet TAKE ONE TABLET BY MOUTH DAILY 30 tablet 1  . doxycycline (VIBRA-TABS) 100 MG tablet Take 1 tablet (100 mg total) by mouth 2 (two) times daily. (Patient not taking: Reported on 12/09/2019) 20 tablet 0   No facility-administered medications prior to visit.    Allergies  Allergen Reactions  . Penicillins Hives    hives  . Gluten Meal   . Lactose  Intolerance (Gi)     ROS Review of Systems  Constitutional: Negative for diaphoresis, fatigue, fever and unexpected weight change.  HENT: Negative.   Eyes: Negative for photophobia and visual disturbance.  Respiratory: Negative.   Cardiovascular: Negative.   Endocrine: Negative for polyphagia and polyuria.  Musculoskeletal: Negative for gait problem and joint swelling.  Skin: Negative for pallor and rash.  Neurological: Negative for tremors and speech difficulty.  Psychiatric/Behavioral: Positive for dysphoric mood and sleep disturbance. Negative for self-injury and suicidal ideas. The patient is nervous/anxious.    Depression screen [facility] 2/9 12/09/2019 11/08/2019  Decreased Interest 0 0  Down, Depressed, Hopeless 0 0  PHQ - 2 Score 0 0  Altered sleeping 2 1  Tired, decreased energy 3 3  Change in appetite 1 0  Feeling bad or failure about yourself  0 0  Trouble concentrating 1 0  Moving slowly or fidgety/restless 1 0  Suicidal thoughts 0 0  PHQ-9 Score 8 4  Difficult doing work/chores Somewhat difficult Not difficult at all      Objective:    Physical Exam  Constitutional: She is oriented to person, place, and time. She appears well-developed and well-nourished. No distress.  HENT:  Head: Normocephalic and atraumatic.  Right Ear: External ear normal.  Left Ear: External ear normal.  Eyes: Conjunctivae are normal. Right eye exhibits no discharge. Left eye exhibits no discharge. No scleral icterus.  Neck: No JVD present. No tracheal deviation present.  Cardiovascular: Normal rate, regular rhythm and normal heart sounds.  Pulmonary/Chest: Effort normal and breath sounds normal. No stridor.  Neurological: She is alert and oriented to person, place, and time.  Skin: Skin is warm and dry. She is not diaphoretic.  Psychiatric: She has a normal mood and affect. Her behavior is normal.    BP 122/78   Pulse 80   Temp (!) 97.1 F (36.2 C)   Ht 5\' 7"  (1.702 m)   Wt 178 lb 9.6  oz (81 kg)   SpO2 97%   BMI 27.97 kg/m  Wt Readings from Last 3 Encounters:  12/09/19 178 lb 9.6 oz (81 kg)  11/08/19 177 lb (80.3 kg)  03/05/19 170 lb (77.1 kg)     Health Maintenance Due  Topic Date Due  . HIV Screening  03/31/1990  . MAMMOGRAM  05/04/2018    There are no preventive care reminders to display for this patient.  Lab Results  Component  Value Date   TSH 1.70 04/30/2013   Lab Results  Component Value Date   WBC 7.9 03/05/2019   HGB 13.7 03/05/2019   HCT 40.9 03/05/2019   MCV 93.0 03/05/2019   PLT 262 03/05/2019   Lab Results  Component Value Date   NA 134 (L) 03/05/2019   K 4.6 03/05/2019   CO2 24 03/05/2019   GLUCOSE 99 03/05/2019   BUN 18 03/05/2019   CREATININE 0.55 03/05/2019   BILITOT 0.3 12/19/2017   ALKPHOS 37 (L) 12/19/2017   AST 15 12/19/2017   ALT 13 12/19/2017   PROT 7.0 12/19/2017   ALBUMIN 4.3 12/19/2017   CALCIUM 9.0 03/05/2019   ANIONGAP 7 03/05/2019   GFR 87.07 12/19/2017   Lab Results  Component Value Date   CHOL 164 12/19/2017   Lab Results  Component Value Date   HDL 66.10 12/19/2017   Lab Results  Component Value Date   LDLCALC 67 12/19/2017   Lab Results  Component Value Date   TRIG 154.0 (H) 12/19/2017   Lab Results  Component Value Date   CHOLHDL 2 12/19/2017   No results found for: HGBA1C    Assessment & Plan:   Problem List Items Addressed This Visit      Other   Depression with anxiety - Primary   Relevant Medications   escitalopram (LEXAPRO) 20 MG tablet      Meds ordered this encounter  Medications  . escitalopram (LEXAPRO) 20 MG tablet    Sig: Take 1 tablet (20 mg total) by mouth daily.    Dispense:  90 tablet    Refill:  3    Follow-up: Return in about 6 months (around 06/07/2020).    Libby Maw, MD

## 2020-12-16 NOTE — Progress Notes (Signed)
Virtual Visit via Video Note  I connected with Carolyn Hebert on 12/17/20 at 10:00 AM EST by a video enabled telemedicine application and verified that I am speaking with the correct person using two identifiers. Location patient: home Location provider: work  Persons participating in the virtual visit: patient, provider  I discussed the limitations of evaluation and management by telemedicine and the availability of in person appointments. The patient expressed understanding and agreed to proceed.  Chief Complaint  Patient presents with  . Follow-up    F/u meds. Declines flu and covid vaccines today     HPI: Carolyn Hebert is a 45 y.o. female patient of Dr. Ethelene Hal seen today for routine f/u on anxiety and depression and refill of her lexapro 20mg  daily. She was last seen by PCP in 11/2019.  Today pt reports being on lexapro 20mg  x 11 years. She feels med is effective, no side effects.  She does note waking up "wide awake" in the middle of the night and it takes time to fall back to sleep. She thinks this happens most nights, 90% of the time. She takes melatonin 6mg  of controlled-release nightly. Work is busy (busy season) and moved in 07/2020. She is not interesetd in Rx med at this time.   Depression screen [facility] 2/9 12/17/2020 12/09/2019 11/08/2019  Decreased Interest 1 0 0  Down, Depressed, Hopeless 0 0 0  PHQ - 2 Score 1 0 0  Altered sleeping 1 2 1   Tired, decreased energy 3 3 3   Change in appetite 1 1 0  Feeling bad or failure about yourself  0 0 0  Trouble concentrating 0 1 0  Moving slowly or fidgety/restless 0 1 0  Suicidal thoughts 0 0 0  PHQ-9 Score 6 8 4   Difficult doing work/chores Somewhat difficult Somewhat difficult Not difficult at all   GAD 7 : Generalized Anxiety Score 12/17/2020 12/09/2019  Nervous, Anxious, on Edge 1 2  Control/stop worrying 1 1  Worry too much - different things 0 2  Trouble relaxing 1 1  Restless 0 0  Easily annoyed or irritable 1 1   Afraid - awful might happen 0 1  Total GAD 7 Score 4 8  Anxiety Difficulty Somewhat difficult Somewhat difficult    Past Medical History:  Diagnosis Date  . Abnormal Pap smear   . Anxiety   . Depression   . Dysplastic nevus   . HSV-2 infection   . IBS (irritable bowel syndrome)   . LGSIL (low grade squamous intraepithelial lesion) on Pap smear   . Lyme disease   . Ovarian cyst     Past Surgical History:  Procedure Laterality Date  . CESAREAN SECTION    . CYSTECTOMY    . LAPAROSCOPY    . SEPTOPLASTY WITH ETHMOIDECTOMY, AND MAXILLARY ANTROSTOMY  [place] , [place]  . WISDOM TOOTH EXTRACTION      Family History  Problem Relation Age of Onset  . Cancer Maternal Aunt   . Cancer Maternal Grandfather        prostate/lung  . Hypertension Father   . Hyperlipidemia Mother   . Cancer Maternal Uncle        melanoma  . Deep vein thrombosis Maternal Grandmother     Social History   Tobacco Use  . Smoking status: Former Smoker    Years: 7.00    Types: Cigarettes    Quit date: 11/29/1999    Years since quitting: 21.0  . Smokeless  tobacco: Never Used  . Tobacco comment: "couple of cigs on weekend"  Substance Use Topics  . Alcohol use: Yes    Comment: 2-3 beers per week  . Drug use: No     Current Outpatient Medications:  .  escitalopram (LEXAPRO) 20 MG tablet, Take 1 tablet (20 mg total) by mouth daily., Disp: 90 tablet, Rfl: 3 .  fluticasone (FLONASE) 50 MCG/ACT nasal spray, Place 2 sprays into both nostrils daily., Disp: 16 g, Rfl: 2 .  Olopatadine HCl (PATADAY) 0.2 % SOLN, Apply 1 drop to eye daily. Both Eyes., Disp: 5 mL, Rfl: 5 .  Thyroid 113.75 MG TABS, Take 1 tablet by mouth daily., Disp: , Rfl:   Allergies  Allergen Reactions  . Penicillins Hives    hives  . Gluten Meal   . Lactose Intolerance (Gi)       ROS: See pertinent positives and negatives per HPI.   EXAM:  VITALS per patient if applicable: Ht 5\' 7"  (1.702 m)   Wt 170 lb (77.1 kg)  Comment: pt reported  BMI 26.63 kg/m    GENERAL: alert, oriented, appears well and in no acute distress  HEENT: atraumatic, conjunctiva clear, no obvious abnormalities on inspection of external nose and ears  NECK: normal movements of the head and neck  LUNGS: on inspection no signs of respiratory distress, breathing rate appears normal, no obvious gross SOB, gasping or wheezing, no conversational dyspnea  CV: no obvious cyanosis  PSYCH/NEURO: pleasant and cooperative, no obvious depression or anxiety, speech and thought processing grossly intact   ASSESSMENT AND PLAN: 1. Depression with anxiety - stable, well-controlled on current med/dose x years Refill: - escitalopram (LEXAPRO) 20 MG tablet; Take 1 tablet (20 mg total) by mouth daily.  Dispense: 90 tablet; Refill: 3 - f/u in 12 mo or sooner PRN to discuss sleep, anxiety, or depression    I discussed the assessment and treatment plan with the patient. The patient was provided an opportunity to ask questions and all were answered. The patient agreed with the plan and demonstrated an understanding of the instructions.   The patient was advised to call back or seek an in-person evaluation if the symptoms worsen or if the condition fails to improve as anticipated.   Letta Median, DO

## 2020-12-17 ENCOUNTER — Encounter: Payer: Self-pay | Admitting: Family Medicine

## 2020-12-17 ENCOUNTER — Telehealth (INDEPENDENT_AMBULATORY_CARE_PROVIDER_SITE_OTHER): Payer: 59 | Admitting: Family Medicine

## 2020-12-17 VITALS — Ht 67.0 in | Wt 170.0 lb

## 2020-12-17 DIAGNOSIS — F418 Other specified anxiety disorders: Secondary | ICD-10-CM | POA: Diagnosis not present

## 2020-12-17 MED ORDER — ESCITALOPRAM OXALATE 20 MG PO TABS: 20.0000 mg | ORAL_TABLET | Freq: Every day | ORAL | 3 refills | Status: DC

## 2021-04-08 ENCOUNTER — Telehealth (INDEPENDENT_AMBULATORY_CARE_PROVIDER_SITE_OTHER): Payer: 59 | Admitting: Family Medicine

## 2021-04-08 DIAGNOSIS — R6889 Other general symptoms and signs: Secondary | ICD-10-CM

## 2021-04-08 MED ORDER — OSELTAMIVIR PHOSPHATE 75 MG PO CAPS: 75.0000 mg | ORAL_CAPSULE | Freq: Two times a day (BID) | ORAL | 0 refills | Status: DC

## 2021-04-08 NOTE — Progress Notes (Signed)
Virtual Visit via Video Note  I connected with [name]  on 04/08/21 at 12:40 PM EDT by a video enabled telemedicine application and verified that I am speaking with the correct person using two identifiers.  Location patient: home, [place] Location provider:work or home office Persons participating in the virtual visit: patient, provider  I discussed the limitations of evaluation and management by telemedicine and the availability of in person appointments. The patient expressed understanding and agreed to proceed.   HPI:  Acute telemedicine visit for flu like symptoms: -Onset: 2 days ago -Symptoms include: nasal congestion, ears hurt, cough, HA, body aches chills, tired -Denies: CP, SOB, inability to eat/drink/get out of bed -daughter had a stomach bug a few weeks ago -Has tried:cough drops, fluids -Pertinent past medical history: HTN, hx of asthma remotely, had covid in Christmas -Pertinent medication allergies: Allergies  Allergen Reactions  . Penicillins Hives    hives  . Gluten Meal   . Lactose Intolerance (Gi)    -COVID-19 vaccine status: not vaccinated for covid or flu  ROS: See pertinent positives and negatives per HPI.  Past Medical History:  Diagnosis Date  . Abnormal Pap smear   . Anxiety   . Depression   . Dysplastic nevus   . HSV-2 infection   . IBS (irritable bowel syndrome)   . LGSIL (low grade squamous intraepithelial lesion) on Pap smear   . Lyme disease   . Ovarian cyst     Past Surgical History:  Procedure Laterality Date  . CESAREAN SECTION    . CYSTECTOMY    . LAPAROSCOPY    . SEPTOPLASTY WITH ETHMOIDECTOMY, AND MAXILLARY ANTROSTOMY  [place] , [place]  . WISDOM TOOTH EXTRACTION       Current Outpatient Medications:  .  oseltamivir (TAMIFLU) 75 MG capsule, Take 1 capsule (75 mg total) by mouth 2 (two) times daily., Disp: 10 capsule, Rfl: 0 .  escitalopram (LEXAPRO) 20 MG tablet, Take 1 tablet (20 mg total) by mouth daily., Disp: 90 tablet,  Rfl: 3 .  fluticasone (FLONASE) 50 MCG/ACT nasal spray, Place 2 sprays into both nostrils daily., Disp: 16 g, Rfl: 2 .  Olopatadine HCl (PATADAY) 0.2 % SOLN, Apply 1 drop to eye daily. Both Eyes., Disp: 5 mL, Rfl: 5 .  Thyroid 113.75 MG TABS, Take 1 tablet by mouth daily., Disp: , Rfl:   EXAM:  VITALS per patient if applicable:  GENERAL: alert, oriented, appears well and in no acute distress  HEENT: atraumatic, conjunttiva clear, no obvious abnormalities on inspection of external nose and ears  NECK: normal movements of the head and neck  LUNGS: on inspection no signs of respiratory distress, breathing rate appears normal, no obvious gross SOB, gasping or wheezing  CV: no obvious cyanosis  MS: moves all visible extremities without noticeable abnormality  PSYCH/NEURO: pleasant and cooperative, no obvious depression or anxiety, speech and thought processing grossly intact  ASSESSMENT AND PLAN:  Discussed the following assessment and plan:  Flu-like symptoms  -we discussed possible serious and likely etiologies, options for evaluation and workup, limitations of telemedicine visit vs in person visit, treatment, treatment risks and precautions. Pt prefers to treat via telemedicine empirically rather than in person at this moment. Query influenza, Covid19 vs other. Discussed options for testing, treatment, potential complications, precautions. She opted for starting tamiflu and doing covid testing at pharmacy or via home test. She reports no longer needs any treatment for asthma as was remote.  Work/School slipped offered: declined  Scheduled follow up with PCP offered: she agrees to follow up as needed. Advised to seek prompt in person care if worsening, new symptoms arise, or if is not improving with treatment. Discussed options for inperson care if PCP office not available. Did let this patient know that I only do telemedicine on Tuesdays and Thursdays for [place]. Advised to schedule  follow up visit with PCP or [facility] if any further questions or concerns to avoid delays in care.   I discussed the assessment and treatment plan with the patient. The patient was provided an opportunity to ask questions and all were answered. The patient agreed with the plan and demonstrated an understanding of the instructions.     Carolyn Kern, DO

## 2021-04-08 NOTE — Patient Instructions (Signed)
  HOME CARE TIPS:  -[place] testing information: https://www.[facility].org/ OR [phone] Most pharmacies also offer testing and home test kits. If the Covid19 test is positive, please make a prompt follow up visit with your primary care office or with [facility] to discuss treatment options. Treatments for Covid19 are best given early in the course of the illness.   -I sent the medication(s) we discussed to your pharmacy: Meds ordered this encounter  Medications  . oseltamivir (TAMIFLU) 75 MG capsule    Sig: Take 1 capsule (75 mg total) by mouth 2 (two) times daily.    Dispense:  10 capsule    Refill:  0     -can use tylenol or aleve if needed for fevers, aches and pains per instructions  -can use nasal saline a few times per day if you have nasal congestion; sometimes  a short course of Afrin nasal spray for 3 days can help with symptoms as well  -stay hydrated, drink plenty of fluids and eat small healthy meals - avoid dairy  -can take 1000 IU (14mcg) Vit D3 and 100-500 mg of Vit C daily per instructions  -If the Covid test is positive, check out the [facility] website for more information on home care, transmission and treatment for COVID19  -follow up with your doctor in 2-3 days unless improving and feeling better  -stay home while sick, except to seek medical care. If you have COVID19, ideally it would be best to stay home for a full 10 days since the onset of symptoms PLUS one day of no fever and feeling better. Wear a good mask that fits snugly (such as N95 or KN95) if around others to reduce the risk of transmission.  It was nice to meet you today, and I really hope you are feeling better soon. I help [facility] out with telemedicine visits on Tuesdays and Thursdays and am available for visits on those days. If you have any concerns or questions following this visit please schedule a follow up visit with your Primary Care doctor or seek  care at a local urgent care clinic to avoid delays in care.    Seek in person care or schedule a follow up video visit promptly if your symptoms worsen, new concerns arise or you are not improving with treatment. Call 911 and/or seek emergency care if your symptoms are severe or life threatening.

## 2021-10-26 ENCOUNTER — Other Ambulatory Visit: Payer: Self-pay

## 2021-10-26 ENCOUNTER — Ambulatory Visit (INDEPENDENT_AMBULATORY_CARE_PROVIDER_SITE_OTHER): Payer: 59 | Admitting: Family Medicine

## 2021-10-26 VITALS — BP 126/82 | HR 80 | Temp 97.5°F | Ht 67.0 in | Wt 183.8 lb

## 2021-10-26 DIAGNOSIS — J069 Acute upper respiratory infection, unspecified: Secondary | ICD-10-CM

## 2021-10-26 LAB — POC INFLUENZA A&B (BINAX/QUICKVUE)
Influenza A, POC: NEGATIVE
Influenza B, POC: NEGATIVE

## 2021-10-26 NOTE — Progress Notes (Signed)
[facility] [address] [place] [address] Dept: [phone] Dept Fax: [phone]  Office Visit  Subjective:    Patient ID: Carolyn Hebert, female    DOB: 1975-11-30, 45 y.o..   MRN: [id number]  Chief Complaint  Patient presents with   Acute Visit    C/o having body aches, chills, fatigue/weakness x 2 days.  She has taken only vitamin C.  Negative Covid test 10/25/21   declines flu shot.    History of Present Illness:  Patient is in today for evaluation of a 2-day history of body aches, chills, fatigue, fluctuating appetite, chest tightness, and rhinorrhea. She denies significant fever, sore throat, or GI symptoms. She notes she was exposed to her sister-in-law on Thanksgiving Day, who had a febrile illness at the time. She denies tobacco use. She notes she has been taking Vitamin C, zinc, and other supplements to support her immune system. She did a home COVID test yesterday, which was negative.  Past Medical History: Patient Active Problem List   Diagnosis Date Noted   Lyme disease, unspecified 01/31/2018   Essential hypertension 09/22/2017   Acquired hypothyroidism 10/24/2015   Chronic hyponatremia 10/24/2015   Vertigo 11/18/2013   Personal history of chronic sinusitis 11/02/2012   Ovarian cyst    LGSIL (low grade squamous intraepithelial lesion) on Pap smear    Anxiety    Depression with anxiety    HSV-2 infection    ALLERGIC RHINITIS 06/20/2008   FATIGUE 04/11/2008   Past Surgical History:  Procedure Laterality Date   CESAREAN SECTION     CYSTECTOMY     LAPAROSCOPY     SEPTOPLASTY WITH ETHMOIDECTOMY, AND MAXILLARY ANTROSTOMY  2011    [place] , [place]   WISDOM TOOTH EXTRACTION     Family History  Problem Relation Age of Onset   Cancer Maternal Aunt    Cancer Maternal Grandfather        prostate/lung   Hypertension Father    Hyperlipidemia Mother    Cancer Maternal Uncle        melanoma   Deep  vein thrombosis Maternal Grandmother    Outpatient Medications Prior to Visit  Medication Sig Dispense Refill   escitalopram (LEXAPRO) 20 MG tablet Take 1 tablet (20 mg total) by mouth daily. 90 tablet 3   fluticasone (FLONASE) 50 MCG/ACT nasal spray Place 2 sprays into both nostrils daily. 16 g 2   Olopatadine HCl (PATADAY) 0.2 % SOLN Apply 1 drop to eye daily. Both Eyes. 5 mL 5   progesterone (PROMETRIUM) 100 MG capsule Take 100 mg by mouth at bedtime.     Thyroid 113.75 MG TABS Take 1 tablet by mouth daily.     valACYclovir (VALTREX) 1000 MG tablet valacyclovir 1 gram tablet     oseltamivir (TAMIFLU) 75 MG capsule Take 1 capsule (75 mg total) by mouth 2 (two) times daily. 10 capsule 0   No facility-administered medications prior to visit.   Allergies  Allergen Reactions   Penicillins Hives    hives   Gluten Meal    Lactose Intolerance (Gi)    Objective:   Today's Vitals   10/26/21 1006  BP: 126/82  Pulse: 80  Temp: (!) 97.5 F (36.4 C)  TempSrc: Temporal  SpO2: 97%  Weight: 183 lb 12.8 oz (83.4 kg)  Height: 5\' 7"  (1.702 m)   Body mass index is 28.79 kg/m.   General: Well developed, well nourished. No acute distress. HEENT: Normocephalic, non-traumatic. PERRL,  EOMI. Conjunctiva clear. Fundiscopic exam shows normal disc and vasculature. External ears normal. EAC and TMs normal bilaterally.   Nose clear without congestion or rhinorrhea. Mucous membranes moist. Oropharynx clear.   Good dentition. Neck: Supple. No lymphadenopathy. No thyromegaly. Lungs: Clear to auscultation bilaterally. No wheezing, rales or rhonchi. CV: RRR without murmurs or rubs. Pulses 2+ bilaterally. Psych: Alert and oriented. Normal mood and affect.  Health Maintenance Due  Topic Date Due   COVID-19 Vaccine (1) Never done   Pneumococcal Vaccine 67-51 Years old (1 - PCV) Never done   HIV Screening  Never done   Hepatitis C Screening  Never done   MAMMOGRAM  05/04/2018   COLONOSCOPY (Pts  45-37yrs Insurance coverage will need to be confirmed)  Never done   PAP SMEAR-Modifier  05/04/2020   INFLUENZA VACCINE  06/28/2021   Lab Results POCT Influenza A & B: Negative    Assessment & Plan:   1. Viral URI Discussed home care for viral illness, including rest, pushing fluids, and OTC medications as needed for symptom relief. Recommend Tylenol 500 mg and Ibuprofen 400 mg qid as needed for body aches. Follow-up if needed for worsening or persistent symptoms.  - POC Influenza A&B ([facility] test) - Novel Coronavirus, NAA ([facility])  Haydee Salter, MD

## 2021-10-27 LAB — SARS-COV-2, NAA 2 DAY TAT

## 2021-10-27 LAB — NOVEL CORONAVIRUS, NAA: SARS-CoV-2, NAA: NOT DETECTED

## 2022-01-21 ENCOUNTER — Encounter: Payer: Self-pay | Admitting: Family Medicine

## 2022-01-21 ENCOUNTER — Telehealth (INDEPENDENT_AMBULATORY_CARE_PROVIDER_SITE_OTHER): Payer: 59 | Admitting: Family Medicine

## 2022-01-21 VITALS — Ht 67.0 in | Wt 183.0 lb

## 2022-01-21 DIAGNOSIS — F418 Other specified anxiety disorders: Secondary | ICD-10-CM

## 2022-01-21 MED ORDER — ESCITALOPRAM OXALATE 20 MG PO TABS: 20.0000 mg | ORAL_TABLET | Freq: Every day | ORAL | 3 refills | Status: DC

## 2022-01-21 NOTE — Progress Notes (Signed)
Established Patient Office Visit  Subjective:  Patient ID: Carolyn Hebert, female    DOB: [birth year]  Age: 46 y.o. MRN: [id number]  CC:  Chief Complaint  Patient presents with   Medication Refill    Refill on medication, no concerns.     HPI Carolyn Hebert presents for follow-up of anxiety and depression.  Lexapro continues to work well for her.  She has taken it since the birth of her now 67 year old daughter.  Sleeping better.  Female care is through GYN.  Blood work showed that she is menopausal.  She has been experiencing hot flashes.  She sees [facility] for chronic Lyme's disease.  Her routine blood work is checked there.  Seeing the dentist twice yearly.  Last colonoscopy was this past June.  Past Medical History:  Diagnosis Date   Abnormal Pap smear    Anxiety    Depression    Dysplastic nevus    HSV-2 infection    IBS (irritable bowel syndrome)    LGSIL (low grade squamous intraepithelial lesion) on Pap smear    Lyme disease    Ovarian cyst     Past Surgical History:  Procedure Laterality Date   CESAREAN SECTION     CYSTECTOMY     LAPAROSCOPY     SEPTOPLASTY WITH ETHMOIDECTOMY, AND MAXILLARY ANTROSTOMY  2011    [place] , [place]   WISDOM TOOTH EXTRACTION      Family History  Problem Relation Age of Onset   Cancer Maternal Aunt    Cancer Maternal Grandfather        prostate/lung   Hypertension Father    Hyperlipidemia Mother    Cancer Maternal Uncle        melanoma   Deep vein thrombosis Maternal Grandmother     Social History   Socioeconomic History   Marital status: Divorced    Spouse name: Not on file   Number of children: 1   Years of education: Not on file   Highest education level: Not on file  Occupational History   Occupation: sales  Tobacco Use   Smoking status: Former    Years: 7.00    Types: Cigarettes    Quit date: 11/29/1999    Years since quitting: 22.1   Smokeless tobacco: Never   Tobacco comments:    "couple of  cigs on weekend"  Substance and Sexual Activity   Alcohol use: Yes    Comment: 2-3 beers per week   Drug use: No   Sexual activity: Yes    Birth control/protection: Pill  Other Topics Concern   Not on file  Social History Narrative   Not on file   Social Determinants of Health   Financial Resource Strain: Not on file  Food Insecurity: Not on file  Transportation Needs: Not on file  Physical Activity: Not on file  Stress: Not on file  Social Connections: Not on file  Intimate Partner Violence: Not on file    Outpatient Medications Prior to Visit  Medication Sig Dispense Refill   fluticasone (FLONASE) 50 MCG/ACT nasal spray Place 2 sprays into both nostrils daily. 16 g 2   Olopatadine HCl (PATADAY) 0.2 % SOLN Apply 1 drop to eye daily. Both Eyes. 5 mL 5   progesterone (PROMETRIUM) 100 MG capsule Take 100 mg by mouth at bedtime.     Thyroid 113.75 MG TABS Take 1 tablet by mouth daily.     valACYclovir (VALTREX) 1000 MG tablet valacyclovir 1 gram tablet  escitalopram (LEXAPRO) 20 MG tablet Take 1 tablet (20 mg total) by mouth daily. 90 tablet 3   No facility-administered medications prior to visit.    Allergies  Allergen Reactions   Penicillins Hives    hives   Gluten Meal    Lactose Intolerance (Gi)     ROS Review of Systems  Constitutional:  Negative for chills, diaphoresis, fatigue, fever and unexpected weight change.  HENT: Negative.    Respiratory: Negative.    Cardiovascular: Negative.   Gastrointestinal: Negative.   Genitourinary: Negative.      Depression screen [facility] 2/9 01/21/2022 12/17/2020 12/09/2019  Decreased Interest 0 1 0  Down, Depressed, Hopeless 0 0 0  PHQ - 2 Score 0 1 0  Altered sleeping 1 1 2   Tired, decreased energy 1 3 3   Change in appetite 0 1 1  Feeling bad or failure about yourself  0 0 0  Trouble concentrating 0 0 1  Moving slowly or fidgety/restless 0 0 1  Suicidal thoughts 0 0 0  PHQ-9 Score 2 6 8   Difficult doing work/chores  Not difficult at all Somewhat difficult Somewhat difficult     Objective:    Physical Exam Constitutional:      General: She is not in acute distress.    Appearance: Normal appearance. She is not ill-appearing, toxic-appearing or diaphoretic.  HENT:     Head: Normocephalic and atraumatic.     Right Ear: External ear normal.     Left Ear: External ear normal.  Eyes:     General: No scleral icterus.       Right eye: No discharge.        Left eye: No discharge.     Extraocular Movements: Extraocular movements intact.     Conjunctiva/sclera: Conjunctivae normal.  Pulmonary:     Effort: Pulmonary effort is normal.  Skin:    General: Skin is warm and dry.  Neurological:     Mental Status: She is alert and oriented to person, place, and time.  Psychiatric:        Mood and Affect: Mood normal.        Behavior: Behavior normal.    Ht 5\' 7"  (1.702 m)    Wt 183 lb (83 kg)    BMI 28.66 kg/m  Wt Readings from Last 3 Encounters:  01/21/22 183 lb (83 kg)  10/26/21 183 lb 12.8 oz (83.4 kg)  12/17/20 170 lb (77.1 kg)     Health Maintenance Due  Topic Date Due   HIV Screening  Never done   Hepatitis C Screening  Never done    There are no preventive care reminders to display for this patient.  Lab Results  Component Value Date   TSH 1.70 04/30/2013   Lab Results  Component Value Date   WBC 7.9 03/05/2019   HGB 13.7 03/05/2019   HCT 40.9 03/05/2019   MCV 93.0 03/05/2019   PLT 262 03/05/2019   Lab Results  Component Value Date   NA 134 (L) 03/05/2019   K 4.6 03/05/2019   CO2 24 03/05/2019   GLUCOSE 99 03/05/2019   BUN 18 03/05/2019   CREATININE 0.55 03/05/2019   BILITOT 0.3 12/19/2017   ALKPHOS 37 (L) 12/19/2017   AST 15 12/19/2017   ALT 13 12/19/2017   PROT 7.0 12/19/2017   ALBUMIN 4.3 12/19/2017   CALCIUM 9.0 03/05/2019   ANIONGAP 7 03/05/2019   GFR 87.07 12/19/2017   Lab Results  Component Value Date   CHOL  164 12/19/2017   Lab Results  Component  Value Date   HDL 66.10 12/19/2017   Lab Results  Component Value Date   LDLCALC 67 12/19/2017   Lab Results  Component Value Date   TRIG 154.0 (H) 12/19/2017   Lab Results  Component Value Date   CHOLHDL 2 12/19/2017   No results found for: HGBA1C    Assessment & Plan:   Problem List Items Addressed This Visit       Other   Depression with anxiety - Primary   Relevant Medications   escitalopram (LEXAPRO) 20 MG tablet    Meds ordered this encounter  Medications   escitalopram (LEXAPRO) 20 MG tablet    Sig: Take 1 tablet (20 mg total) by mouth daily.    Dispense:  90 tablet    Refill:  3    Follow-up: Return in about 1 year (around 01/21/2023), or if symptoms worsen or fail to improve.   Continue with Lexapro.  Advised exercise for 30 minutes 5 days weekly.  Walking is fine. Libby Maw, MD  Virtual Visit via Video Note  I connected with Carolyn Hebert on 01/21/22 at  4:00 PM EST by a video enabled telemedicine application and verified that I am speaking with the correct person using two identifiers.  Location: Patient: home alone in a room.  Provider: work   I discussed the limitations of evaluation and management by telemedicine and the availability of in person appointments. The patient expressed understanding and agreed to proceed.  History of Present Illness:    Observations/Objective:   Assessment and Plan:   Follow Up Instructions:    I discussed the assessment and treatment plan with the patient. The patient was provided an opportunity to ask questions and all were answered. The patient agreed with the plan and demonstrated an understanding of the instructions.   The patient was advised to call back or seek an in-person evaluation if the symptoms worsen or if the condition fails to improve as anticipated.  I provided 25 minutes of non-face-to-face time during this encounter.   Libby Maw, MD

## 2022-02-18 ENCOUNTER — Telehealth: Payer: Self-pay | Admitting: Family Medicine

## 2022-02-18 ENCOUNTER — Encounter: Payer: Self-pay | Admitting: Family Medicine

## 2022-02-18 ENCOUNTER — Telehealth (INDEPENDENT_AMBULATORY_CARE_PROVIDER_SITE_OTHER): Payer: 59 | Admitting: Family Medicine

## 2022-02-18 VITALS — Ht 67.0 in | Wt 183.0 lb

## 2022-02-18 DIAGNOSIS — J020 Streptococcal pharyngitis: Secondary | ICD-10-CM

## 2022-02-18 MED ORDER — CLARITHROMYCIN ER 500 MG PO TB24: 1000.0000 mg | ORAL_TABLET | Freq: Every day | ORAL | 0 refills | Status: AC

## 2022-02-18 NOTE — Progress Notes (Signed)
? ?Established Patient Office Visit ? ?Subjective:  ?Patient ID: Carolyn Hebert, female    DOB: 03/03/1976  Age: 46 y.o. MRN: [id number] ? ?CC:  ?Chief Complaint  ?Patient presents with  ? Follow-up  ?  Pt wants to discuss med she is taking  ? Sore Throat  ? ? ?HPI ?Carolyn Hebert presents for evaluation and treatment of 4-day history of headache, elevated temperature, sore throat, tender lymph nodes and poor appetite.  Daughter recently diagnosed with strep.  Patient with history of chronic Lyme's and started doxycycline 3 days ago.  She seems to be improving.  She is feeling better ? ?Past Medical History:  ?Diagnosis Date  ? Abnormal Pap smear   ? Anxiety   ? Depression   ? Dysplastic nevus   ? HSV-2 infection   ? IBS (irritable bowel syndrome)   ? LGSIL (low grade squamous intraepithelial lesion) on Pap smear   ? Lyme disease   ? Ovarian cyst   ? ? ?Past Surgical History:  ?Procedure Laterality Date  ? CESAREAN SECTION    ? CYSTECTOMY    ? LAPAROSCOPY    ? SEPTOPLASTY WITH ETHMOIDECTOMY, AND MAXILLARY ANTROSTOMY  2011  ?  [place] , [place]  ? WISDOM TOOTH EXTRACTION    ? ? ?Family History  ?Problem Relation Age of Onset  ? Cancer Maternal Aunt   ? Cancer Maternal Grandfather   ?     prostate/lung  ? Hypertension Father   ? Hyperlipidemia Mother   ? Cancer Maternal Uncle   ?     melanoma  ? Deep vein thrombosis Maternal Grandmother   ? ? ?Social History  ? ?Socioeconomic History  ? Marital status: Divorced  ?  Spouse name: Not on file  ? Number of children: 1  ? Years of education: Not on file  ? Highest education level: Not on file  ?Occupational History  ? Occupation: Press photographer  ?Tobacco Use  ? Smoking status: Former  ?  Years: 7.00  ?  Types: Cigarettes  ?  Quit date: 11/29/1999  ?  Years since quitting: 22.2  ? Smokeless tobacco: Never  ? Tobacco comments:  ?  "couple of cigs on weekend"  ?Substance and Sexual Activity  ? Alcohol use: Yes  ?  Comment: 2-3 beers per week  ? Drug use: No  ? Sexual activity: Yes   ?  Birth control/protection: Pill  ?Other Topics Concern  ? Not on file  ?Social History Narrative  ? Not on file  ? ?Social Determinants of Health  ? ?Financial Resource Strain: Not on file  ?Food Insecurity: Not on file  ?Transportation Needs: Not on file  ?Physical Activity: Not on file  ?Stress: Not on file  ?Social Connections: Not on file  ?Intimate Partner Violence: Not on file  ? ? ?Outpatient Medications Prior to Visit  ?Medication Sig Dispense Refill  ? escitalopram (LEXAPRO) 20 MG tablet Take 1 tablet (20 mg total) by mouth daily. 90 tablet 3  ? fluticasone (FLONASE) 50 MCG/ACT nasal spray Place 2 sprays into both nostrils daily. 16 g 2  ? Olopatadine HCl (PATADAY) 0.2 % SOLN Apply 1 drop to eye daily. Both Eyes. 5 mL 5  ? progesterone (PROMETRIUM) 100 MG capsule Take 100 mg by mouth at bedtime.    ? Thyroid 113.75 MG TABS Take 1 tablet by mouth daily.    ? valACYclovir (VALTREX) 1000 MG tablet valacyclovir 1 gram tablet    ? ?No facility-administered medications prior to  visit.  ? ? ?Allergies  ?Allergen Reactions  ? Penicillins Hives  ?  hives  ? Gluten Meal   ? Lactose Intolerance (Gi)   ? ? ?ROS ?Review of Systems  ?Constitutional:  Negative for chills, diaphoresis, fatigue, fever and unexpected weight change.  ?HENT:  Positive for sore throat. Negative for congestion and voice change.   ?Eyes:  Negative for photophobia and visual disturbance.  ?Respiratory:  Negative for cough and shortness of breath.   ?Gastrointestinal:  Positive for abdominal pain. Negative for nausea and vomiting.  ?Musculoskeletal:  Positive for arthralgias.  ?Neurological:  Positive for headaches.  ? ?  ?Objective:  ?  ?Physical Exam ?Vitals and nursing note reviewed.  ?Constitutional:   ?   Appearance: She is well-developed.  ?Pulmonary:  ?   Effort: Pulmonary effort is normal.  ?Neurological:  ?   Mental Status: She is alert and oriented to person, place, and time.  ?Psychiatric:     ?   Mood and Affect: Mood normal.     ?    Behavior: Behavior normal.  ? ? ?Ht '5\' 7"'$  (1.702 m)   Wt 183 lb (83 kg) Comment: Pt Report  BMI 28.66 kg/m?  ?Wt Readings from Last 3 Encounters:  ?02/18/22 183 lb (83 kg)  ?01/21/22 183 lb (83 kg)  ?10/26/21 183 lb 12.8 oz (83.4 kg)  ? ? ? ?Health Maintenance Due  ?Topic Date Due  ? HIV Screening  Never done  ? Hepatitis C Screening  Never done  ? ? ?There are no preventive care reminders to display for this patient. ? ?Lab Results  ?Component Value Date  ? TSH 1.70 04/30/2013  ? ?Lab Results  ?Component Value Date  ? WBC 7.9 03/05/2019  ? HGB 13.7 03/05/2019  ? HCT 40.9 03/05/2019  ? MCV 93.0 03/05/2019  ? PLT 262 03/05/2019  ? ?Lab Results  ?Component Value Date  ? NA 134 (L) 03/05/2019  ? K 4.6 03/05/2019  ? CO2 24 03/05/2019  ? GLUCOSE 99 03/05/2019  ? BUN 18 03/05/2019  ? CREATININE 0.55 03/05/2019  ? BILITOT 0.3 12/19/2017  ? ALKPHOS 37 (L) 12/19/2017  ? AST 15 12/19/2017  ? ALT 13 12/19/2017  ? PROT 7.0 12/19/2017  ? ALBUMIN 4.3 12/19/2017  ? CALCIUM 9.0 03/05/2019  ? ANIONGAP 7 03/05/2019  ? GFR 87.07 12/19/2017  ? ?Lab Results  ?Component Value Date  ? CHOL 164 12/19/2017  ? ?Lab Results  ?Component Value Date  ? HDL 66.10 12/19/2017  ? ?Lab Results  ?Component Value Date  ? [place] 67 12/19/2017  ? ?Lab Results  ?Component Value Date  ? TRIG 154.0 (H) 12/19/2017  ? ?Lab Results  ?Component Value Date  ? CHOLHDL 2 12/19/2017  ? ?No results found for: HGBA1C ? ?  ?Assessment & Plan:  ? ?Problem List Items Addressed This Visit   ?None ?Visit Diagnoses   ? ? Strep pharyngitis    -  Primary  ? Relevant Medications  ? clarithromycin (BIAXIN XL) 500 MG 24 hr tablet  ? ?  ? ? ?Meds ordered this encounter  ?Medications  ? clarithromycin (BIAXIN XL) 500 MG 24 hr tablet  ?  Sig: Take 2 tablets (1,000 mg total) by mouth daily for 10 days.  ?  Dispense:  20 tablet  ?  Refill:  0  ? ? ?Follow-up: Return if symptoms worsen or fail to improve.  ?Explained that doxycycline is unreliable for treating strep and she will  start a  10-day course of Biaxin.  Follow-up if not improving in a few days. ? ?Libby Maw, MD ? ?Virtual Visit via Video Note ? ?I connected with Carolyn Hebert on 02/18/22 at  3:20 PM EDT by a video enabled telemedicine application and verified that I am speaking with the correct person using two identifiers. ? ?Location: ?Patient: home alone in her office.  ?Provider: work ?  ?I discussed the limitations of evaluation and management by telemedicine and the availability of in person appointments. The patient expressed understanding and agreed to proceed. ? ?History of Present Illness: ? ?  ?Observations/Objective: ? ? ?Assessment and Plan: ? ? ?Follow Up Instructions: ? ?  ?I discussed the assessment and treatment plan with the patient. The patient was provided an opportunity to ask questions and all were answered. The patient agreed with the plan and demonstrated an understanding of the instructions. ?  ?The patient was advised to call back or seek an in-person evaluation if the symptoms worsen or if the condition fails to improve as anticipated. ? ?I provided 20 minutes of non-face-to-face time during this encounter. ? ? ?Libby Maw, MD  ?

## 2022-02-18 NOTE — Telephone Encounter (Signed)
Pt requesting call back from the nurse, its about the medication that she is on for strep throat. Pt took leftover antibiotic that she had. I scheduled her for a video visit today ?

## 2022-02-18 NOTE — Telephone Encounter (Signed)
Returned patients call, no answer LMTCB pt has VV at 3pm today.  ?

## 2022-02-23 ENCOUNTER — Encounter: Payer: Self-pay | Admitting: Family Medicine

## 2022-11-26 ENCOUNTER — Other Ambulatory Visit: Payer: Self-pay | Admitting: Family Medicine

## 2022-11-26 DIAGNOSIS — F418 Other specified anxiety disorders: Secondary | ICD-10-CM

## 2023-01-19 ENCOUNTER — Encounter: Payer: Self-pay | Admitting: Nurse Practitioner

## 2023-01-19 ENCOUNTER — Ambulatory Visit (INDEPENDENT_AMBULATORY_CARE_PROVIDER_SITE_OTHER): Payer: 59 | Admitting: Nurse Practitioner

## 2023-01-19 VITALS — BP 138/88 | HR 72 | Temp 98.7°F | Ht 67.0 in | Wt 173.0 lb

## 2023-01-19 DIAGNOSIS — R059 Cough, unspecified: Secondary | ICD-10-CM

## 2023-01-19 DIAGNOSIS — U071 COVID-19: Secondary | ICD-10-CM

## 2023-01-19 LAB — POC COVID19 BINAXNOW: SARS Coronavirus 2 Ag: POSITIVE — AB

## 2023-01-19 LAB — POCT INFLUENZA A/B
Influenza A, POC: NEGATIVE
Influenza B, POC: NEGATIVE

## 2023-01-19 MED ORDER — MOLNUPIRAVIR EUA 200MG CAPSULE: 4.0000 | ORAL_CAPSULE | Freq: Two times a day (BID) | ORAL | 0 refills | Status: AC

## 2023-01-19 NOTE — Patient Instructions (Signed)
It was great to see you!  Start molunipiravir 4 capsules twice a day for 5 days with food.  Keep drinking plenty of fluid.  Isolate until Sunday, wear a mask through Thursday.   Let's follow-up if your symptoms worsen or don't improve.   Take care,  Vance Peper, NP

## 2023-01-19 NOTE — Progress Notes (Signed)
Acute Office Visit  Subjective:     Patient ID: Carolyn Hebert, female    DOB: June 07, 1976, 47 y.o.   MRN: [id number]  Chief Complaint  Patient presents with   Cough    Chills, scratchy throat, headache since Tuesday, home covid test negative    HPI Patient is in today for chills, scratchy throat, and headache for 2 days.   UPPER RESPIRATORY TRACT INFECTION  Fever:  chills Cough: yes Shortness of breath: yes Wheezing: yes Chest pain: no Chest tightness: yes Chest congestion: no Nasal congestion: yes Runny nose: yes Post nasal drip: yes Sneezing:  some Sore throat: yes Swollen glands: no Sinus pressure: yes Headache: yes Face pain: yes Toothache: no Ear pain: no bilateral Ear pressure: yes bilateral Eyes red/itching:yes Eye drainage/crusting: no  Vomiting:  nausea Rash: no Fatigue: yes Sick contacts: no Strep contacts: no  Context: better Recurrent sinusitis: no Relief with OTC cold/cough medications: no  Treatments attempted: immunity booster, vitamin C   ROS See pertinent positives and negatives per HPI.     Objective:    BP 138/88 (BP Location: Right Arm)   Pulse 72   Temp 98.7 F (37.1 C)   Ht '5\' 7"'$  (1.702 m)   Wt 173 lb (78.5 kg)   SpO2 97%   BMI 27.10 kg/m    Physical Exam Vitals and nursing note reviewed.  Constitutional:      General: She is not in acute distress.    Appearance: Normal appearance.  HENT:     Head: Normocephalic.     Right Ear: Tympanic membrane, ear canal and external ear normal.     Left Ear: Tympanic membrane, ear canal and external ear normal.     Nose:     Right Sinus: No maxillary sinus tenderness or frontal sinus tenderness.     Left Sinus: No maxillary sinus tenderness or frontal sinus tenderness.     Mouth/Throat:     Pharynx: Posterior oropharyngeal erythema present. No oropharyngeal exudate.  Eyes:     Conjunctiva/sclera: Conjunctivae normal.  Cardiovascular:     Rate and Rhythm: Normal rate and  regular rhythm.     Pulses: Normal pulses.     Heart sounds: Normal heart sounds.  Pulmonary:     Effort: Pulmonary effort is normal.     Breath sounds: Normal breath sounds.  Musculoskeletal:     Cervical back: Normal range of motion and neck supple. No tenderness.  Lymphadenopathy:     Cervical: No cervical adenopathy.  Skin:    General: Skin is warm.  Neurological:     General: No focal deficit present.     Mental Status: She is alert and oriented to person, place, and time.  Psychiatric:        Mood and Affect: Mood normal.        Behavior: Behavior normal.        Thought Content: Thought content normal.        Judgment: Judgment normal.     Results for orders placed or performed in visit on 01/19/23  POCT Influenza A/B  Result Value Ref Range   Influenza A, POC Negative Negative   Influenza B, POC Negative Negative  POC COVID-19 BinaxNow  Result Value Ref Range   SARS Coronavirus 2 Ag Positive (A) Negative        Assessment & Plan:   Problem List Items Addressed This Visit       Other   COVID-19 - Primary  Do not have recent GFR, unable to prescribe paxlovid. Will give molnupiravir BID x5 days. Encourage fluids, rest. Can take OTC medications. Work note given.   Reviewed home care instructions for COVID. Advised self-isolation at home for at least 5 days. After 5 days, if improved and fever resolved, can be in public, but should wear a mask around others for an additional 5 days. If symptoms, esp, dyspnea develops/worsens, recommend in-person evaluation at either an urgent care or the emergency room.       Relevant Medications   molnupiravir EUA (LAGEVRIO) 200 mg CAPS capsule   Other Visit Diagnoses     Cough, unspecified type       POC covid positive, flu negative. Drink plenty of fluids. Continue OTC medications.   Relevant Orders   POCT Influenza A/B (Completed)   POC COVID-19 BinaxNow (Completed)       Meds ordered this encounter  Medications    molnupiravir EUA (LAGEVRIO) 200 mg CAPS capsule    Sig: Take 4 capsules (800 mg total) by mouth 2 (two) times daily for 5 days.    Dispense:  40 capsule    Refill:  0    Return if symptoms worsen or fail to improve.  Charyl Dancer, NP

## 2023-01-20 ENCOUNTER — Encounter: Payer: Self-pay | Admitting: Nurse Practitioner

## 2023-01-20 DIAGNOSIS — U071 COVID-19: Secondary | ICD-10-CM | POA: Insufficient documentation

## 2023-01-20 NOTE — Assessment & Plan Note (Signed)
Do not have recent GFR, unable to prescribe paxlovid. Will give molnupiravir BID x5 days. Encourage fluids, rest. Can take OTC medications. Work note given.   Reviewed home care instructions for COVID. Advised self-isolation at home for at least 5 days. After 5 days, if improved and fever resolved, can be in public, but should wear a mask around others for an additional 5 days. If symptoms, esp, dyspnea develops/worsens, recommend in-person evaluation at either an urgent care or the emergency room.

## 2024-09-03 ENCOUNTER — Other Ambulatory Visit: Payer: Self-pay | Admitting: Obstetrics and Gynecology

## 2024-09-03 DIAGNOSIS — Z1231 Encounter for screening mammogram for malignant neoplasm of breast: Secondary | ICD-10-CM

## 2024-11-05 ENCOUNTER — Ambulatory Visit

## 2024-12-03 ENCOUNTER — Ambulatory Visit

## 2024-12-18 ENCOUNTER — Ambulatory Visit
Admission: RE | Admit: 2024-12-18 | Discharge: 2024-12-18 | Disposition: A | Source: Ambulatory Visit | Attending: Obstetrics and Gynecology | Admitting: Obstetrics and Gynecology

## 2024-12-18 DIAGNOSIS — Z1231 Encounter for screening mammogram for malignant neoplasm of breast: Secondary | ICD-10-CM
